# Patient Record
Sex: Male | Born: 1985 | Race: White | Hispanic: No | Marital: Single
Health system: Southern US, Community
[De-identification: ages and names within clinical notes are randomized; demographics above are authoritative.]

## PROBLEM LIST (undated history)

## (undated) DIAGNOSIS — S069XAA Unspecified intracranial injury with loss of consciousness status unknown, initial encounter: Secondary | ICD-10-CM

## (undated) DIAGNOSIS — I10 Essential (primary) hypertension: Secondary | ICD-10-CM

---

## 2021-08-16 ENCOUNTER — Emergency Department: Payer: Medicare PPO

## 2021-08-16 ENCOUNTER — Other Ambulatory Visit: Payer: Self-pay

## 2021-08-16 ENCOUNTER — Emergency Department
Admission: EM | Admit: 2021-08-16 | Discharge: 2021-12-15 | Disposition: A | Discharge status: Home / Self Care | Payer: Medicare PPO | Attending: Emergency Medicine | Admitting: Emergency Medicine

## 2021-08-16 DIAGNOSIS — Z20822 Contact with and (suspected) exposure to covid-19: Secondary | ICD-10-CM | POA: Insufficient documentation

## 2021-08-16 DIAGNOSIS — Z789 Other specified health status: Secondary | ICD-10-CM

## 2021-08-16 DIAGNOSIS — R339 Retention of urine, unspecified: Secondary | ICD-10-CM | POA: Diagnosis not present

## 2021-08-16 DIAGNOSIS — Z741 Need for assistance with personal care: Secondary | ICD-10-CM | POA: Diagnosis not present

## 2021-08-16 HISTORY — DX: Essential (primary) hypertension: I10

## 2021-08-16 HISTORY — DX: Unspecified intracranial injury with loss of consciousness status unknown, initial encounter: S06.9XAA

## 2021-08-16 LAB — COMPREHENSIVE METABOLIC PANEL
ALT: 43 U/L (ref 0–44)
AST: 26 U/L (ref 15–41)
Albumin: 4.2 g/dL (ref 3.5–5.0)
Alkaline Phosphatase: 47 U/L (ref 38–126)
Anion gap: 3 — ABNORMAL LOW (ref 5–15)
BUN: 8 mg/dL (ref 6–20)
CO2: 29 mmol/L (ref 22–32)
Calcium: 9 mg/dL (ref 8.9–10.3)
Chloride: 106 mmol/L (ref 98–111)
Creatinine, Ser: 0.66 mg/dL (ref 0.61–1.24)
GFR, Estimated: >60 mL/min (ref >60)
Glucose, Bld: 97 mg/dL (ref 70–99)
Potassium: 4.1 mmol/L (ref 3.5–5.1)
Sodium: 138 mmol/L (ref 135–145)
Total Bilirubin: 0.7 mg/dL (ref 0.3–1.2)
Total Protein: 7.2 g/dL (ref 6.5–8.1)

## 2021-08-16 LAB — CBC WITH DIFFERENTIAL/PLATELET
Abs Immature Granulocytes: 0.01 10*3/uL (ref 0.00–0.07)
Basophils Absolute: 0 10*3/uL (ref 0.0–0.1)
Basophils Relative: 1 %
Eosinophils Absolute: 0.1 10*3/uL (ref 0.0–0.5)
Eosinophils Relative: 2 %
HCT: 42 % (ref 39.0–52.0)
Hemoglobin: 13.5 g/dL (ref 13.0–17.0)
Immature Granulocytes: 0 %
Lymphocytes Relative: 37 %
Lymphs Abs: 1.7 10*3/uL (ref 0.7–4.0)
MCH: 27.8 pg (ref 26.0–34.0)
MCHC: 32.1 g/dL (ref 30.0–36.0)
MCV: 86.6 fL (ref 80.0–100.0)
Monocytes Absolute: 0.5 10*3/uL (ref 0.1–1.0)
Monocytes Relative: 11 %
Neutro Abs: 2.3 10*3/uL (ref 1.7–7.7)
Neutrophils Relative %: 49 %
Platelets: 263 10*3/uL (ref 150–400)
RBC: 4.85 MIL/uL (ref 4.22–5.81)
RDW: 12.8 % (ref 11.5–15.5)
WBC: 4.7 10*3/uL (ref 4.0–10.5)
nRBC: 0 % (ref 0.0–0.2)

## 2021-08-16 LAB — URINALYSIS, ROUTINE W REFLEX MICROSCOPIC
Bilirubin Urine: NEGATIVE
Glucose, UA: NEGATIVE mg/dL
Hgb urine dipstick: NEGATIVE
Ketones, ur: NEGATIVE mg/dL
Leukocytes,Ua: NEGATIVE
Nitrite: NEGATIVE
Protein, ur: NEGATIVE mg/dL
Specific Gravity, Urine: 1.002 — ABNORMAL LOW (ref 1.005–1.030)
pH: 7 (ref 5.0–8.0)

## 2021-08-16 LAB — RESP PANEL BY RT-PCR (FLU A&B, COVID) ARPGX2
Influenza A by PCR: NEGATIVE
Influenza B by PCR: NEGATIVE
SARS Coronavirus 2 by RT PCR: NEGATIVE

## 2021-08-16 MED ORDER — LINACLOTIDE 72 MCG PO CAPS
145.0000 ug | ORAL_CAPSULE | Freq: Every day | ORAL | Status: DC
  Administered 2021-08-17 – 2021-10-21 (×63): 145 ug via ORAL
  Administered 2021-10-22: 144 ug via ORAL
  Administered 2021-10-23 – 2021-11-03 (×11): 145 ug via ORAL
  Administered 2021-11-04: 144 ug via ORAL
  Administered 2021-11-05 – 2021-11-10 (×5): 145 ug via ORAL
  Administered 2021-11-11: 144 ug via ORAL
  Administered 2021-11-12: 145 ug via ORAL
  Administered 2021-11-14 – 2021-11-15 (×2): 144 ug via ORAL
  Administered 2021-11-16: 145 ug via ORAL
  Administered 2021-11-18 – 2021-11-19 (×2): 144 ug via ORAL
  Administered 2021-11-21 – 2021-11-26 (×6): 145 ug via ORAL
  Administered 2021-11-27: 144 ug via ORAL
  Administered 2021-11-28 – 2021-11-30 (×3): 145 ug via ORAL
  Administered 2021-12-01: 144 ug via ORAL
  Administered 2021-12-02 – 2021-12-10 (×8): 145 ug via ORAL
  Administered 2021-12-11 – 2021-12-12 (×2): 144 ug via ORAL
  Administered 2021-12-13 – 2021-12-15 (×3): 145 ug via ORAL
  Filled 2021-08-16: qty 2
  Filled 2021-08-16 (×2): qty 1
  Filled 2021-08-16 (×2): qty 2
  Filled 2021-08-16: qty 1
  Filled 2021-08-16: qty 2
  Filled 2021-08-16: qty 1
  Filled 2021-08-16 (×2): qty 2
  Filled 2021-08-16 (×7): qty 1
  Filled 2021-08-16: qty 2
  Filled 2021-08-16: qty 1
  Filled 2021-08-16: qty 2
  Filled 2021-08-16 (×2): qty 1
  Filled 2021-08-16: qty 2
  Filled 2021-08-16: qty 1
  Filled 2021-08-16: qty 2
  Filled 2021-08-16: qty 1
  Filled 2021-08-16: qty 2
  Filled 2021-08-16 (×3): qty 1
  Filled 2021-08-16: qty 2
  Filled 2021-08-16: qty 1
  Filled 2021-08-16 (×2): qty 2
  Filled 2021-08-16: qty 1
  Filled 2021-08-16: qty 2
  Filled 2021-08-16 (×3): qty 1
  Filled 2021-08-16 (×4): qty 2
  Filled 2021-08-16: qty 1
  Filled 2021-08-16: qty 2
  Filled 2021-08-16: qty 1
  Filled 2021-08-16: qty 2
  Filled 2021-08-16: qty 1
  Filled 2021-08-16 (×2): qty 2
  Filled 2021-08-16 (×2): qty 1
  Filled 2021-08-16: qty 2
  Filled 2021-08-16: qty 1
  Filled 2021-08-16 (×3): qty 2
  Filled 2021-08-16 (×2): qty 1
  Filled 2021-08-16: qty 2
  Filled 2021-08-16 (×7): qty 1
  Filled 2021-08-16: qty 2
  Filled 2021-08-16 (×2): qty 1
  Filled 2021-08-16: qty 2
  Filled 2021-08-16: qty 1
  Filled 2021-08-16 (×2): qty 2
  Filled 2021-08-16: qty 1
  Filled 2021-08-16: qty 2
  Filled 2021-08-16 (×3): qty 1
  Filled 2021-08-16 (×4): qty 2
  Filled 2021-08-16 (×2): qty 1
  Filled 2021-08-16: qty 2
  Filled 2021-08-16: qty 1
  Filled 2021-08-16: qty 2
  Filled 2021-08-16: qty 1
  Filled 2021-08-16: qty 2
  Filled 2021-08-16: qty 1
  Filled 2021-08-16 (×3): qty 2
  Filled 2021-08-16 (×2): qty 1
  Filled 2021-08-16 (×2): qty 2
  Filled 2021-08-16 (×3): qty 1
  Filled 2021-08-16 (×2): qty 2
  Filled 2021-08-16 (×2): qty 1
  Filled 2021-08-16 (×2): qty 2
  Filled 2021-08-16: qty 1
  Filled 2021-08-16: qty 2
  Filled 2021-08-16: qty 1
  Filled 2021-08-16 (×5): qty 2
  Filled 2021-08-16: qty 1
  Filled 2021-08-16: qty 2
  Filled 2021-08-16 (×3): qty 1
  Filled 2021-08-16 (×3): qty 2
  Filled 2021-08-16 (×4): qty 1
  Filled 2021-08-16: qty 2

## 2021-08-16 MED ORDER — CLINDAMYCIN PHOSPHATE 1 % EX GEL
1.0000 "application " | Freq: Every day | CUTANEOUS | Status: DC | PRN
  Filled 2021-08-16: qty 30

## 2021-08-16 MED ORDER — TACROLIMUS 0.1 % EX OINT: 1.0000 "application " | TOPICAL_OINTMENT | Freq: Two times a day (BID) | CUTANEOUS | Status: DC

## 2021-08-16 MED ORDER — RIVAROXABAN 10 MG PO TABS
10.0000 mg | ORAL_TABLET | Freq: Every evening | ORAL | Status: DC
  Administered 2021-08-16 – 2021-12-15 (×119): 10 mg via ORAL
  Filled 2021-08-16 (×134): qty 1

## 2021-08-16 MED ORDER — DIAZEPAM 2 MG PO TABS
2.0000 mg | ORAL_TABLET | Freq: Every day | ORAL | Status: DC
  Administered 2021-08-16 – 2021-12-14 (×115): 2 mg via ORAL
  Filled 2021-08-16 (×120): qty 1

## 2021-08-16 MED ORDER — MODAFINIL 100 MG PO TABS
50.0000 mg | ORAL_TABLET | Freq: Every morning | ORAL | Status: DC
  Administered 2021-08-17 – 2021-12-15 (×115): 50 mg via ORAL
  Filled 2021-08-16 (×126): qty 1

## 2021-08-16 MED ORDER — BUSPIRONE HCL 5 MG PO TABS
10.0000 mg | ORAL_TABLET | Freq: Three times a day (TID) | ORAL | Status: DC
Start: 2021-08-16 — End: 2021-08-27
  Administered 2021-08-17 – 2021-08-27 (×31): 10 mg via ORAL
  Filled 2021-08-16 (×35): qty 2

## 2021-08-16 MED ORDER — LORATADINE 10 MG PO TABS
10.0000 mg | ORAL_TABLET | Freq: Every day | ORAL | Status: DC
  Administered 2021-08-16 – 2021-12-15 (×121): 10 mg via ORAL
  Filled 2021-08-16 (×122): qty 1

## 2021-08-16 MED ORDER — IPRATROPIUM BROMIDE 0.06 % NA SOLN
2.0000 | Freq: Three times a day (TID) | NASAL | Status: DC
  Administered 2021-08-16 – 2021-12-12 (×223): 2 via NASAL
  Filled 2021-08-16 (×4): qty 15

## 2021-08-16 MED ORDER — LAMOTRIGINE 25 MG PO TABS
50.0000 mg | ORAL_TABLET | Freq: Every morning | ORAL | Status: DC
Start: 2021-08-17 — End: 2021-09-16
  Administered 2021-08-17 – 2021-09-16 (×31): 50 mg via ORAL
  Filled 2021-08-16 (×32): qty 2

## 2021-08-16 MED ORDER — PROPRANOLOL HCL 20 MG PO TABS
10.0000 mg | ORAL_TABLET | Freq: Two times a day (BID) | ORAL | Status: DC
  Administered 2021-08-16 – 2021-12-15 (×232): 10 mg via ORAL
  Filled 2021-08-16 (×150): qty 1
  Filled 2021-08-16: qty 0.5
  Filled 2021-08-16 (×92): qty 1

## 2021-08-16 MED ORDER — VITAMIN D 25 MCG (1000 UNIT) PO TABS
1000.0000 [IU] | ORAL_TABLET | Freq: Every day | ORAL | Status: DC
  Administered 2021-08-16 – 2021-12-15 (×121): 1000 [IU] via ORAL
  Filled 2021-08-16 (×123): qty 1

## 2021-08-16 MED ORDER — CLONIDINE HCL 0.1 MG PO TABS
0.1000 mg | ORAL_TABLET | Freq: Every day | ORAL | Status: DC
Start: 2021-08-16 — End: 2021-12-15
  Administered 2021-08-16 – 2021-12-14 (×118): 0.1 mg via ORAL
  Filled 2021-08-16 (×124): qty 1

## 2021-08-16 MED ORDER — CICLOPIROX OLAMINE 0.77 % EX CREA: 1.0000 "application " | TOPICAL_CREAM | Freq: Two times a day (BID) | CUTANEOUS | Status: DC

## 2021-08-16 MED ORDER — TAMSULOSIN HCL 0.4 MG PO CAPS
0.4000 mg | ORAL_CAPSULE | Freq: Every day | ORAL | Status: DC
  Administered 2021-08-16 – 2021-12-15 (×121): 0.4 mg via ORAL
  Filled 2021-08-16 (×122): qty 1

## 2021-08-16 MED ORDER — AMMONIUM LACTATE 12 % EX LOTN
1.0000 "application " | TOPICAL_LOTION | Freq: Two times a day (BID) | CUTANEOUS | Status: DC
  Administered 2021-08-17 – 2021-12-14 (×164): 1 via TOPICAL
  Filled 2021-08-16 (×5): qty 400

## 2021-08-16 MED ORDER — GABAPENTIN 100 MG PO CAPS
100.0000 mg | ORAL_CAPSULE | Freq: Three times a day (TID) | ORAL | Status: DC
  Administered 2021-08-16 – 2021-08-22 (×17): 100 mg via ORAL
  Administered 2021-08-22: 200 mg via ORAL
  Administered 2021-08-22: 100 mg via ORAL
  Administered 2021-08-23 – 2021-08-25 (×6): 200 mg via ORAL
  Administered 2021-08-25 – 2021-08-27 (×8): 100 mg via ORAL
  Administered 2021-08-28: 200 mg via ORAL
  Administered 2021-08-28 – 2021-09-01 (×14): 100 mg via ORAL
  Administered 2021-09-02: 200 mg via ORAL
  Administered 2021-09-02: 100 mg via ORAL
  Administered 2021-09-02: 200 mg via ORAL
  Administered 2021-09-03 – 2021-09-04 (×6): 100 mg via ORAL
  Administered 2021-09-05: 200 mg via ORAL
  Administered 2021-09-05 – 2021-09-09 (×12): 100 mg via ORAL
  Administered 2021-09-09: 200 mg via ORAL
  Administered 2021-09-09 – 2021-09-10 (×3): 100 mg via ORAL
  Administered 2021-09-10: 200 mg via ORAL
  Administered 2021-09-11 – 2021-09-15 (×14): 100 mg via ORAL
  Filled 2021-08-16 (×9): qty 1
  Filled 2021-08-16 (×2): qty 2
  Filled 2021-08-16 (×4): qty 1
  Filled 2021-08-16 (×2): qty 2
  Filled 2021-08-16 (×2): qty 1
  Filled 2021-08-16: qty 2
  Filled 2021-08-16 (×2): qty 1
  Filled 2021-08-16: qty 2
  Filled 2021-08-16: qty 1
  Filled 2021-08-16: qty 2
  Filled 2021-08-16 (×15): qty 1
  Filled 2021-08-16: qty 2
  Filled 2021-08-16 (×5): qty 1
  Filled 2021-08-16: qty 2
  Filled 2021-08-16 (×6): qty 1
  Filled 2021-08-16: qty 2
  Filled 2021-08-16: qty 1
  Filled 2021-08-16 (×2): qty 2
  Filled 2021-08-16 (×10): qty 1
  Filled 2021-08-16: qty 2
  Filled 2021-08-16 (×6): qty 1
  Filled 2021-08-16: qty 2
  Filled 2021-08-16 (×3): qty 1
  Filled 2021-08-16: qty 2
  Filled 2021-08-16 (×11): qty 1

## 2021-08-16 MED ORDER — TRAZODONE HCL 50 MG PO TABS
50.0000 mg | ORAL_TABLET | Freq: Every day | ORAL | Status: DC
Start: 2021-08-16 — End: 2021-12-15
  Administered 2021-08-16 – 2021-12-14 (×118): 50 mg via ORAL
  Filled 2021-08-16 (×124): qty 1

## 2021-08-16 MED ORDER — CITALOPRAM HYDROBROMIDE 20 MG PO TABS
20.0000 mg | ORAL_TABLET | Freq: Two times a day (BID) | ORAL | Status: DC
Start: 2021-08-16 — End: 2021-12-15
  Administered 2021-08-16 – 2021-12-15 (×241): 20 mg via ORAL
  Filled 2021-08-16 (×242): qty 1

## 2021-08-16 MED ORDER — OMEPRAZOLE 20 MG PO CPDR
20.0000 mg | DELAYED_RELEASE_CAPSULE | Freq: Every day | ORAL | Status: DC
  Filled 2021-08-16: qty 1

## 2021-08-16 NOTE — ED Notes (Signed)
Pt arrived with condom cath in place. Pt cleaned and linens changed.  ?

## 2021-08-16 NOTE — ED Provider Notes (Signed)
? ?[facility] ?Provider Note ? ? ? Event Date/Time  ? First MD Initiated Contact with Patient 08/16/21 1502   ?  (approximate) ? ? ?History  ? ?Urinary Retention ? ? ?HPI ? ?Derrick Jordan is a 36 y.o. male with past medical history of TBI, bedbound, here with initial report of urinary retention.  However, patient is voiding spontaneously.  He denies any complaints to me, the history is somewhat limited due to his cognition.  Per report from EMS, they were initially called to the scene due to urinary retention.  However, mother then stated that she is having too much difficulty taking care of him and does not feel safe managing him at home by herself.  They have home health but it has not been helping or is an opportune time.  She is not available currently, but will reassess once available.  On my assessment, patient denies any complaints.  Denies any pain.  Shakes his head no to any chest pain or abdominal pain.  Remainder of history limited due to mental status. ? ?Level 5 caveat invoked as remainder of history, ROS, and physical exam limited due to patient's cognition. ? ? ? ?Physical Exam  ? ?Triage Vital Signs: ?ED Triage Vitals  ?Enc Vitals Group  ?   BP 08/16/21 1450 122/78  ?   Pulse Rate 08/16/21 1452 82  ?   Resp 08/16/21 1450 18  ?   Temp 08/16/21 1453 98.1 ?F (36.7 ?C)  ?   Temp Source 08/16/21 1453 Oral  ?   SpO2 08/16/21 1452 98 %  ?   Weight --   ?   Height --   ?   Head Circumference --   ?   Peak Flow --   ?   Pain Score 08/16/21 1453 0  ?   Pain Loc --   ?   Pain Edu? --   ?   Excl. in GC? --   ? ? ?Most recent vital signs: ?Vitals:  ? 08/16/21 2219 08/16/21 2221  ?BP: 124/75 124/75  ?Pulse: 73 82  ?Resp:  17  ?Temp:    ?SpO2:  98%  ? ? ? ?General: Awake, no distress. 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 ?CV:  Good peripheral perfusion. Heart RRR. ?Resp:  Normal effort. Lungs CTAB.  ?Abd:  No distention. No TTP. Old scars from PEG tube, surgeries. ?Other:  No significant  wounds/pressure ulcers noted. ? ? ?ED Results / Procedures / Treatments  ? ?Labs ?(all labs ordered are listed, but only abnormal results are displayed) ?Labs Reviewed  ?COMPREHENSIVE METABOLIC PANEL - Abnormal; Notable for the following components:  ?    Result Value  ? Anion gap 3 (*)   ? All other components within normal limits  ?URINALYSIS, ROUTINE W REFLEX MICROSCOPIC - Abnormal; Notable for the following components:  ? Color, Urine YELLOW (*)   ? APPearance CLEAR (*)   ? Specific Gravity, Urine 1.002 (*)   ? All other components within normal limits  ?RESP PANEL BY RT-PCR (FLU A&B, COVID) ARPGX2  ?CBC WITH DIFFERENTIAL/PLATELET  ? ? ? ?EKG ? ? ? ?RADIOLOGY ?CXR: Low lung volumes, likely atelectasis ? ? ?I also independently reviewed and agree with radiologist interpretations. ? ? ?PROCEDURES: ? ?Critical Care performed: No ? ?MEDICATIONS ORDERED IN ED: ?Medications  ?ammonium lactate (LAC-HYDRIN) 12 % lotion 1 application. (1 application. Topical Given 08/17/21 0028)  ?busPIRone (BUSPAR) tablet 10 mg (10 mg Oral Given 08/17/21 0027)  ?cholecalciferol (VITAMIN  D3) tablet 1,000 Units (1,000 Units Oral Given 08/16/21 2224)  ?ciclopirox (LOPROX) 0.77 % cream 1 application. (has no administration in time range)  ?citalopram (CELEXA) tablet 20 mg (20 mg Oral Given 08/16/21 2229)  ?clindamycin (CLINDAGEL) 1 % gel 1 application. (has no administration in time range)  ?cloNIDine (CATAPRES) tablet 0.1 mg (0.1 mg Oral Given 08/16/21 2224)  ?diazepam (VALIUM) tablet 2 mg (2 mg Oral Given 08/16/21 2226)  ?loratadine (CLARITIN) tablet 10 mg (10 mg Oral Given 08/16/21 2229)  ?gabapentin (NEURONTIN) capsule 100-200 mg (100 mg Oral Given 08/16/21 2232)  ?ipratropium (ATROVENT) 0.06 % nasal spray 2 spray (2 sprays Each Nare Given 08/16/21 2238)  ?lamoTRIgine (LAMICTAL) tablet 50 mg (has no administration in time range)  ?linaclotide (LINZESS) capsule 145 mcg (has no administration in time range)  ?modafinil (PROVIGIL) tablet 50 mg (has  no administration in time range)  ?omeprazole (PRILOSEC) capsule 20 mg (has no administration in time range)  ?propranolol (INDERAL) tablet 10 mg (10 mg Oral Given 08/16/21 2219)  ?tacrolimus (PROTOPIC) 0.1 % ointment 1 application. (has no administration in time range)  ?tamsulosin (FLOMAX) capsule 0.4 mg (0.4 mg Oral Given 08/16/21 2230)  ?rivaroxaban (XARELTO) tablet 10 mg (10 mg Oral Given 08/16/21 2227)  ?traZODone (DESYREL) tablet 50 mg (50 mg Oral Given 08/16/21 2232)  ? ? ? ?IMPRESSION / MDM / ASSESSMENT AND PLAN / ED COURSE  ?I reviewed the triage vital signs and the nursing notes. ?             ?               ? ? ?The patient is on the cardiac monitor to evaluate for evidence of arrhythmia and/or significant heart rate changes. ? ? ?MDM:  ?36 yo M with h/o TBI here with initial report of urinary retention. Pt voiding spontaneously, however, with no abd discomfort. Discussed pt's care with his mother via telephone - pt has a fairly complex care regimen, which she unfortunately is not able to provide at home at this time. She states that pt has otherwise not been unwell and is at his baseline. No recent illnesses. She had been working with home health, [facility], and [facility] but things "fell through" so she now is not capable of managing his care at home.  ? ?Clinically, pt is overall well and he denies any complaints. Screening labs obtained and are unremarkable. He is voiding well. Will place consult to social work/TOC for placement. No apparent emergent medical condition at this time. Pharmacy has reconciled meds, which were reordered in ED. ? ? ? ? ?MEDICATIONS GIVEN IN ED: ?Medications  ?ammonium lactate (LAC-HYDRIN) 12 % lotion 1 application. (1 application. Topical Given 08/17/21 0028)  ?busPIRone (BUSPAR) tablet 10 mg (10 mg Oral Given 08/17/21 0027)  ?cholecalciferol (VITAMIN D3) tablet 1,000 Units (1,000 Units Oral Given 08/16/21 2224)  ?ciclopirox (LOPROX) 0.77 % cream 1 application. (has no administration in  time range)  ?citalopram (CELEXA) tablet 20 mg (20 mg Oral Given 08/16/21 2229)  ?clindamycin (CLINDAGEL) 1 % gel 1 application. (has no administration in time range)  ?cloNIDine (CATAPRES) tablet 0.1 mg (0.1 mg Oral Given 08/16/21 2224)  ?diazepam (VALIUM) tablet 2 mg (2 mg Oral Given 08/16/21 2226)  ?loratadine (CLARITIN) tablet 10 mg (10 mg Oral Given 08/16/21 2229)  ?gabapentin (NEURONTIN) capsule 100-200 mg (100 mg Oral Given 08/16/21 2232)  ?ipratropium (ATROVENT) 0.06 % nasal spray 2 spray (2 sprays Each Nare Given 08/16/21 2238)  ?lamoTRIgine (LAMICTAL) tablet  50 mg (has no administration in time range)  ?linaclotide (LINZESS) capsule 145 mcg (has no administration in time range)  ?modafinil (PROVIGIL) tablet 50 mg (has no administration in time range)  ?omeprazole (PRILOSEC) capsule 20 mg (has no administration in time range)  ?propranolol (INDERAL) tablet 10 mg (10 mg Oral Given 08/16/21 2219)  ?tacrolimus (PROTOPIC) 0.1 % ointment 1 application. (has no administration in time range)  ?tamsulosin (FLOMAX) capsule 0.4 mg (0.4 mg Oral Given 08/16/21 2230)  ?rivaroxaban (XARELTO) tablet 10 mg (10 mg Oral Given 08/16/21 2227)  ?traZODone (DESYREL) tablet 50 mg (50 mg Oral Given 08/16/21 2232)  ? ? ? ?Consults:  ? ? ? ?EMR reviewed  ? ? ? ? ? ?FINAL CLINICAL IMPRESSION(S) / ED DIAGNOSES  ? ?Final diagnoses:  ?Unable to care for self  ? ? ? ?Rx / DC Orders  ? ?ED Discharge Orders   ? ? None  ? ?  ? ? ? ?Note:  This document was prepared using Dragon voice recognition software and may include unintentional dictation errors. ?  ?Shaune Pollack, MD ?08/17/21 0234 ? ?

## 2021-08-16 NOTE — ED Triage Notes (Addendum)
Patient to ER via [facility] from home. 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 Some verbal communication, alert and oriented to self, place, situation ? ?Patient's main caregiver is mother who states she can no longer take care of him. Mother told EMS that patient has therapy in mornings but is unable to participate and that it needs to be in the afternoon. ? ?Initial complaint was urinary retention however per EMS patient filled urinal PTA. Patient denies pain. Empty drainage bag connected to condom catheter on arrival. ? ?EMS VS- hr 83, ra sats 99%, bp 140/91  ?

## 2021-08-16 NOTE — ED Notes (Signed)
Derrick Jordan mother [phone] ?

## 2021-08-16 NOTE — ED Notes (Signed)
MD spoke with mother unsure of name [phone] ? ?

## 2021-08-16 NOTE — ED Notes (Signed)
Pt removing all his monitor cords wanting to know why he is here.  ?

## 2021-08-16 NOTE — ED Notes (Signed)
Spoke with [employer] to get mothers number was given a number and after 1 to 2 rings going to voicemail ?

## 2021-08-16 NOTE — ED Notes (Signed)
Unable to contact mother.

## 2021-08-16 NOTE — ED Notes (Signed)
Patient bladder scanned. Bladder scan volume shows >269ml. ?

## 2021-08-17 DIAGNOSIS — Z741 Need for assistance with personal care: Secondary | ICD-10-CM | POA: Diagnosis not present

## 2021-08-17 MED ORDER — PANTOPRAZOLE SODIUM 40 MG PO TBEC
40.0000 mg | DELAYED_RELEASE_TABLET | Freq: Every day | ORAL | Status: DC
Start: 2021-08-17 — End: 2021-12-15
  Administered 2021-08-17 – 2021-12-15 (×119): 40 mg via ORAL
  Filled 2021-08-17 (×121): qty 1

## 2021-08-17 NOTE — ED Notes (Signed)
Pt helped with urinal. When asked if pt wants to be put on male purwick or condom cath for comfort, pt refuses and only wants to use urinal. ?

## 2021-08-17 NOTE — ED Notes (Signed)
Pt soiled with urine and food from lunch. Pt accidentally slipped his drink onto himself. Full linen change including sheet, pad, brief, and gown. Pt refused to place male purewick back on and states he just wants to be in the brief.  ?

## 2021-08-17 NOTE — ED Notes (Signed)
Lunch meal tray given at this time.  

## 2021-08-17 NOTE — ED Notes (Signed)
Mother at bedside.

## 2021-08-17 NOTE — ED Notes (Signed)
Given pt breakfast tray, repositioned pt in the bed at this time.  ?

## 2021-08-17 NOTE — ED Notes (Signed)
Helped pt administer PO medication by giving them to him with a spoon. Pt is able to take pills whole. Turned TV on and increased volume so pt can watch TV at this time. ?

## 2021-08-17 NOTE — ED Notes (Signed)
This RN at bedside, pt does not want breakfast at this time.  ?

## 2021-08-17 NOTE — ED Notes (Addendum)
Pt mother gave pt 2mg  motegrity from home supply with this RN at bedside to verify medication ?

## 2021-08-17 NOTE — TOC Initial Note (Signed)
Transition of Care (TOC) - Progression Note  ? ? ?Patient Details  ?Name: Chevez Sambrano ?MRN: [id number] ?Date of Birth: 1986-03-23 ? ?Transition of Care (TOC) CM/SW Contact  ?Taher Vannote L Chosen Geske, LCSWA ?Phone Number: ?08/17/2021, 12:50 PM ? ?Clinical Narrative:    ? ?TOC consult for SNF. Per triage notes, patient is bedbound and patient's mother reported she can no longer take care of him. CSW called patient's mother Judie Bonus. [phone] to discuss preferred [facility] plan of care.  ? ?TOC plan of care ongoing.  ?  ?  ? ?Expected Discharge Plan and Services ?  ?  ?  ?  ?  ?                ?  ?  ?  ?  ?  ?  ?  ?  ?  ?  ? ? ?Social Determinants of Health (SDOH) Interventions ?  ? ?Readmission Risk Interventions ?   ? View : No data to display.  ?  ?  ?  ? ? ?

## 2021-08-17 NOTE — ED Notes (Signed)
Given at cup of water and cup of coffee at this time. Pt needs to changed at this time. Pt refused at this time, explained that we would need to clean him up before the shift ended. Pt verbalized understanding at this time.  ?

## 2021-08-17 NOTE — TOC Progression Note (Signed)
Transition of Care (TOC) - Progression Note  ? ? ?Patient Details  ?Name: Zaylor Jaquish ?MRN: [id number] ?Date of Birth: 1985-09-05 ? ?Transition of Care (TOC) CM/SW Contact  ?Isabela, LCSWA ?Phone Number: ?08/17/2021, 4:10 PM ? ?Clinical Narrative:    ? ?CSW spoke to patient's mother Maudy. Maudy reported having a meeting scheduled with team with [facility] and [facility] tomorrow. Maudy reported the patient has an innovations waiver helping to financially cover the patient's care needs. Maudy explained they would prefer getting care within the home as opposed to a care facility. Maudy explained issues with current private care agency ([facility] increasing prices). CSW offered [facility] list of private care agencies and she accepted but stated she wants to wait to see what is determined at the team meeting tomorrow before switching agencies.  ? ? CSW provided [facility] with weekday staff contact information. TOC plan of care ongoing.  ?  ? ?Expected Discharge Plan and Services ?  ?  ?  ?  ?  ?                ?  ?  ?  ?  ?  ?  ?  ?  ?  ?  ? ? ?Social Determinants of Health (SDOH) Interventions ?  ? ?Readmission Risk Interventions ?   ? View : No data to display.  ?  ?  ?  ? ? ?

## 2021-08-18 DIAGNOSIS — Z741 Need for assistance with personal care: Secondary | ICD-10-CM | POA: Diagnosis not present

## 2021-08-18 NOTE — ED Notes (Signed)
Family provided food for pt, pt meal tray at table.  ?

## 2021-08-18 NOTE — ED Notes (Signed)
Patient eating breakfast at this time.

## 2021-08-18 NOTE — ED Notes (Signed)
Pt had urinated and had bowel movement on self. Cleaned by this RN and Research scientist (life sciences). New bed sheets applied.  ?

## 2021-08-18 NOTE — ED Notes (Signed)
Patient's bed wet. Patient cleaned, bed liens changed, and new gown placed. Patient repositioned in the bed. Bed lower and locked. Apple sauce and water within reach of patient. Remote given to patient and lights turned off as requested.  ?

## 2021-08-18 NOTE — TOC Progression Note (Signed)
Transition of Care (TOC) - Progression Note  ? ? ?Patient Details  ?Name: Derrick Jordan ?MRN: [id number] ?Date of Birth: 1985-11-16 ? ?Transition of Care (TOC) CM/SW Contact  ?Allayne Butcher, RN ?Phone Number: ?08/18/2021, 4:36 PM ? ?Clinical Narrative:    ? ?Attempted to call patient's mother, no answer- left voicemail for return call.  ? ?  ?  ? ?Expected Discharge Plan and Services ?  ?  ?  ?  ?  ?                ?  ?  ?  ?  ?  ?  ?  ?  ?  ?  ? ? ?Social Determinants of Health (SDOH) Interventions ?  ? ?Readmission Risk Interventions ?   ? View : No data to display.  ?  ?  ?  ? ? ?

## 2021-08-18 NOTE — ED Notes (Signed)
Lunch tray at bedside. ?

## 2021-08-18 NOTE — ED Provider Notes (Signed)
----------------------------------------- ?  5:02 AM on 08/18/2021 ?----------------------------------------- ? ? ?Blood pressure 100/70, pulse 75, temperature 97.6 ?F (36.4 ?C), temperature source Oral, resp. rate 16, SpO2 97 %. ? ?The patient is calm and cooperative at this time.  There have been no acute events since the last update.  Awaiting disposition plan from Social Work team(s). ?  ?Irean Hong, MD ?08/18/21 0502 ? ?

## 2021-08-19 DIAGNOSIS — Z741 Need for assistance with personal care: Secondary | ICD-10-CM | POA: Diagnosis not present

## 2021-08-19 NOTE — ED Notes (Signed)
Assisted pt with setting up his lunch tray and cut up pt's food for him. Pt requesting remote to tv; stated this RN will try to find one for him. Pt denies any other needs currently.  ?

## 2021-08-19 NOTE — TOC Progression Note (Signed)
Transition of Care (TOC) - Progression Note  ? ? ?Patient Details  ?Name: Derrick Jordan ?MRN: [id number] ?Date of Birth: February 09, 1986 ? ?Transition of Care (TOC) CM/SW Contact  ?Allayne Butcher, RN ?Phone Number: ?08/19/2021, 5:02 PM ? ?Clinical Narrative:    ?RNCM spoke with patient's mother via phone about plan of care.  Patient had a TBI in 2007, she cared for him at the home from 2007 to 2014 when he was placed at the [facility] in [place].  He lived at the [facility] from 2014 to 2020 and his mother took him out because of the COVID pandemic.  She reports that the patient has an Risk analyst and she should be getting services in the home to care for patient but she has been having to private pay and she cannot afford it any longer- the waiver should cover home services but they have not found an available agency for care.  Mother was working with [facility] but will switch to [facility] since she moved from [facility] to [place].  [facility] CM [place], [phone], his supervisor Lorita Officer [phone].  Patient has not been assigned a [facility] Case worker yet. ? ?TOC will reach out to [facility] tomorrow to touch base on what they have been working on.   ? ? ? ?Expected Discharge Plan:  (IFL- or home with adequate home services) ?Barriers to Discharge: Other (must enter comment) (placement) ? ?Expected Discharge Plan and Services ?Expected Discharge Plan:  (IFL- or home with adequate home services) ?  ?Discharge Planning Services: CM Consult ?  ?Living arrangements for the past 2 months: Single Family Home ?                ?DME Arranged: N/A ?DME Agency: NA ?  ?  ?  ?  ?  ?  ?  ?  ? ? ?Social Determinants of Health (SDOH) Interventions ?  ? ?Readmission Risk Interventions ?   ? View : No data to display.  ?  ?  ?  ? ? ?

## 2021-08-19 NOTE — ED Notes (Addendum)
Pt resting on hospital bed in position of comfort. No needs voiced at this time. Toileting offered, pt denies need at this time. Offered to check pt brief for urine or bowel movements. Pt denies need and does not want assistance at this time. Encouraged pt to verbalize any needs. No c/o voiced by pt.  ?

## 2021-08-19 NOTE — ED Notes (Signed)
Attempted to feed patient breakfast, patient refused at this time. Patient resting, will attempt again when patient is awake. Provided with ice water.  ?

## 2021-08-19 NOTE — ED Notes (Signed)
Pt wet from urine as male external cath had not been put back in place when pt handed off to this RN. Pericare provided and briefs and bed linens changed by this RN and John NT.  ?

## 2021-08-19 NOTE — ED Notes (Signed)
Male external cath applied again.  ?

## 2021-08-19 NOTE — ED Notes (Signed)
Attempted to feed patient breakfast, patient refused at this time. Lights dimmed for comfort. Patient repositioned.  ?

## 2021-08-19 NOTE — ED Provider Notes (Signed)
----------------------------------------- ?  7:00 AM on 08/19/2021 ?----------------------------------------- ? ? ?Blood pressure 116/80, pulse 78, temperature 98.1 ?F (36.7 ?C), temperature source Oral, resp. rate 19, SpO2 100 %. ? ?The patient is calm and cooperative at this time.  There have been no acute events since the last update.  Awaiting disposition plan from [facility] team. ?  ?Hinda Kehr, MD ?08/19/21 1631 ? ?

## 2021-08-20 DIAGNOSIS — Z741 Need for assistance with personal care: Secondary | ICD-10-CM | POA: Diagnosis not present

## 2021-08-20 NOTE — TOC Progression Note (Signed)
Transition of Care (TOC) - Progression Note  ? ? ?Patient Details  ?Name: Derrick Jordan ?MRN: [id number] ?Date of Birth: [birth year] ? ?Transition of Care (TOC) CM/SW Contact  ?Allayne Butcher, RN ?Phone Number: ?08/20/2021, 6:05 PM ? ?Clinical Narrative:    ?Patient's mother did not arrive at the hospital today until 5pm so unable to meet at the bedside. ?RNCM was able to speak with [facility] CM Michel Santee [phone].  Barbara Cower says that he knows the patient and mother very well.  He reports that patient does have an [employer] waiver that would cover home care but the patient's mother has been paying the caregivers from [facility] out of pocket more than what [facility] with the [employer] waiver would pay, this has caused problems, she could loose the waiver, and she promised to pay the workers extra and then did not pay them so they all walked out this past Friday leading to her bringing the patient to the hospital because she cannot care for him.  There are questions about the mother's ability as the patient's guardian.  Barbara Cower made and APS report in [facility] last week.  RNCM will follow up and may make another report tomorrow.   ?Barbara Cower will be handing the case over to [facility] since patient's Medicaid is now in [place].   ? ? ?Expected Discharge Plan:  (IFL- or home with adequate home services) ?Barriers to Discharge: Other (must enter comment) (placement) ? ?Expected Discharge Plan and Services ?Expected Discharge Plan:  (IFL- or home with adequate home services) ?  ?Discharge Planning Services: CM Consult ?  ?Living arrangements for the past 2 months: Single Family Home ?                ?DME Arranged: N/A ?DME Agency: NA ?  ?  ?  ?  ?  ?  ?  ?  ? ? ?Social Determinants of Health (SDOH) Interventions ?  ? ?Readmission Risk Interventions ?   ? View : No data to display.  ?  ?  ?  ? ? ?

## 2021-08-20 NOTE — ED Notes (Signed)
Pt bed soiled and wet. Pt bedding changed and new bedding, brief and external cath placed. Pt in new gown and warm blankets. Pt asking for coffee, but told to wait till morning for that.  ?Pt calm and comfortable at his time.  ?

## 2021-08-20 NOTE — ED Provider Notes (Signed)
Today's Vitals  ? 08/20/21 0125 08/20/21 0300 08/20/21 0415 08/20/21 2951  ?BP: 137/82     ?Pulse: 81     ?Resp: 14     ?Temp: 97.8 ?F (36.6 ?C)     ?TempSrc: Oral     ?SpO2: 97%     ?PainSc:  Asleep Asleep Asleep  ? ?There is no height or weight on file to calculate BMI. ? ? ?No events overnight.  Awaiting social work disposition. ?  ?Dequane Strahan, [name], DO ?08/20/21 8841 ? ?

## 2021-08-20 NOTE — ED Notes (Signed)
Pt turned and was able to eat 75 % of his lunch with nurse assisting  ?

## 2021-08-20 NOTE — ED Notes (Signed)
Patient repositioned in bed for comfort.

## 2021-08-21 DIAGNOSIS — Z741 Need for assistance with personal care: Secondary | ICD-10-CM | POA: Diagnosis not present

## 2021-08-21 NOTE — ED Notes (Signed)
Pt turned and positioned on right side.

## 2021-08-21 NOTE — ED Notes (Signed)
Patient resting quietly at this time.  Respirations even and unlabored.   

## 2021-08-21 NOTE — ED Notes (Signed)
Pharmacy messaged about missing med ?Pt ate 80% of meal, pt given cup of coffee per request. Pt assisted with eating and drinking.  ?

## 2021-08-21 NOTE — TOC Progression Note (Signed)
Transition of Care (TOC) - Progression Note  ? ? ?Patient Details  ?Name: Derrick Jordan ?MRN: [id number] ?Date of Birth: 1985-05-04 ? ?Transition of Care (TOC) CM/SW Contact  ?Allayne Butcher, RN ?Phone Number: ?08/21/2021, 4:45 PM ? ?Clinical Narrative:    ?Joy Sumpter with [place] [employer] has been assigned patient's case.  She will be coming to see patient at the hospital today.  RNCM expressed concerns about mother and her ability to be patient's guardian.   ? ? ?Expected Discharge Plan:  (IFL- or home with adequate home services) ?Barriers to Discharge: Other (must enter comment) (placement) ? ?Expected Discharge Plan and Services ?Expected Discharge Plan:  (IFL- or home with adequate home services) ?  ?Discharge Planning Services: CM Consult ?  ?Living arrangements for the past 2 months: Single Family Home ?                ?DME Arranged: N/A ?DME Agency: NA ?  ?  ?  ?  ?  ?  ?  ?  ? ? ?Social Determinants of Health (SDOH) Interventions ?  ? ?Readmission Risk Interventions ?   ? View : No data to display.  ?  ?  ?  ? ? ?

## 2021-08-21 NOTE — ED Notes (Signed)
Pt cleaned, bedding and pads changed, pericare done, new male external urinary cath placed. Pt set up for breakfast. Pt attempting to feed self with minimal success, MD notified that pt might need OT for adaptive silverware.  ?

## 2021-08-21 NOTE — ED Notes (Signed)
Pt set up for lunch, using silverware and fingers to eat. Pt does not want to use adapter for silverware. Pt ate 90% of meal. Pt watching TV.Pt propped with pillows and blankets.  ?

## 2021-08-21 NOTE — ED Notes (Signed)
50 mg provigil wasted in stericycle with Cathie Beams RN ?

## 2021-08-21 NOTE — ED Provider Notes (Signed)
?    08/20/2021  ? 11:31 PM 08/20/2021  ?  2:35 PM 08/20/2021  ?  1:25 AM  ?Vitals with BMI  ?Systolic 133 113 875  ?Diastolic 67 74 82  ?Pulse 83 76 81  ?  ? ?Patient has been resting comfortably throughout the shift.  He is awaiting social work placement. ?  ?Georga Hacking, MD ?08/21/21 [phone] ? ?

## 2021-08-21 NOTE — ED Notes (Signed)
Pt ate 90% of meal

## 2021-08-21 NOTE — Progress Notes (Signed)
OT Cancellation Note ? ?Patient Details ?Name: Derrick Jordan ?MRN: [id number] ?DOB: [birth year] ? ? ?Cancelled Treatment:    Reason Eval/Treat Not Completed: OT screened, no needs identified, will sign off. Order received and chart reviewed. Pt is total care baseline, reports using hoyer lift for OOB and pt reports self-feeding with SETUP. Provided built up handle and splint check with RN notified to keep washcloth roll in L hand when splint removed. Will sign off.  ? ?Kathie Dike, M.S. OTR/L  ?08/21/21, 1:18 PM  ?ascom [phone] ? ?

## 2021-08-22 DIAGNOSIS — Z741 Need for assistance with personal care: Secondary | ICD-10-CM | POA: Diagnosis not present

## 2021-08-22 MED ORDER — ACETAMINOPHEN 325 MG PO TABS
650.0000 mg | ORAL_TABLET | Freq: Three times a day (TID) | ORAL | Status: DC
  Administered 2021-08-22 – 2021-12-15 (×338): 650 mg via ORAL
  Filled 2021-08-22 (×337): qty 2

## 2021-08-22 NOTE — ED Provider Notes (Signed)
Emergency Medicine Observation Re-evaluation Note ? ?Derrick Jordan is a 36 y.o. male, initially seen for urinary retention.  Currently being seen by social work for placement to an appropriate living facility ? ?No acute events overnight or today. ? ?Physical Exam  ?BP 114/83   Pulse 80   Temp 98.2 ?F (36.8 ?C)   Resp 15   SpO2 98%  ? ?ED Course / MDM  ? ?No recent lab work for review. ? ?Plan  ?Current plan is for placement to an appropriate living facility. ? Derrick Jordan is not under involuntary commitment. ? ? ?  ?Harvest Dark, MD ?08/22/21 2251 ? ?

## 2021-08-22 NOTE — TOC Progression Note (Signed)
Transition of Care (TOC) - Progression Note  ? ? ?Patient Details  ?Name: Derrick Jordan ?MRN: [id number] ?Date of Birth: 1985-12-21 ? ?Transition of Care (TOC) CM/SW Contact  ?Shelbie Hutching, RN ?Phone Number: ?08/22/2021, 3:08 PM ? ?Clinical Narrative:    ?SECURE email sent to Doreatha Massed with [facility], with patient clinical information for her to review.  She will assist with discharge planning.  She will be reaching out to patient's IDD coordinator.   ? ?Expected Discharge Plan:  (IFL- or home with adequate home services) ?Barriers to Discharge: Other (must enter comment) (placement) ? ?Expected Discharge Plan and Services ?Expected Discharge Plan:  (IFL- or home with adequate home services) ?  ?Discharge Planning Services: CM Consult ?  ?Living arrangements for the past 2 months: [place] ?                ?DME Arranged: N/A ?DME Agency: NA ?  ?  ?  ?  ?  ?  ?  ?  ? ? ?Social Determinants of Health (SDOH) Interventions ?  ? ?Readmission Risk Interventions ?   ? View : No data to display.  ?  ?  ?  ? ? ?

## 2021-08-22 NOTE — ED Provider Notes (Signed)
Today's Vitals  ? 08/21/21 2040 08/22/21 0200 08/22/21 0300 08/22/21 0638  ?BP: 110/76   134/87  ?Pulse: 82   85  ?Resp:    19  ?Temp:    (!) 97.5 ?F (36.4 ?C)  ?TempSrc:    Oral  ?SpO2: 97%   98%  ?PainSc:  Asleep Asleep   ? ?There is no height or weight on file to calculate BMI. ? ? ?No acute events overnight.  Awaiting social work disposition. ?  ?Zanna Hawn, [name], DO ?08/22/21 0701 ? ?

## 2021-08-22 NOTE — ED Notes (Signed)
Pt removed male external purfit. Bed was saturated in urine. Pt provided with bed bath, new sheets, blanket, and gown. Pt was repositioned on back. Placed brief over purfit for securement.  ?

## 2021-08-22 NOTE — ED Notes (Signed)
Requested xarelto from Rx. ?

## 2021-08-23 DIAGNOSIS — Z741 Need for assistance with personal care: Secondary | ICD-10-CM | POA: Diagnosis not present

## 2021-08-23 MED ORDER — FLEET ENEMA 7-19 GM/118ML RE ENEM
1.0000 | ENEMA | Freq: Once | RECTAL | Status: AC
Start: 2021-08-23 — End: 2021-08-23
  Administered 2021-08-23: 1 via RECTAL

## 2021-08-23 NOTE — ED Notes (Signed)
Private caregiver at bedside gave pt bed bath and cleaned pt up. ?

## 2021-08-23 NOTE — ED Provider Notes (Signed)
----------------------------------------- ?  5:21 AM on 08/23/2021 ?----------------------------------------- ? ? ?Blood pressure 114/83, pulse 80, temperature 98.2 ?F (36.8 ?C), resp. rate 15, SpO2 98 %. ? ?The patient is calm and cooperative at this time.  There have been no acute events since the last update.  Awaiting disposition plan from Social Work team. ?  ?Irean Hong, MD ?08/23/21 [phone] ? ?

## 2021-08-23 NOTE — ED Notes (Signed)
Private caregiver to administer fleet enema per request of mother and patient.  ?

## 2021-08-23 NOTE — ED Notes (Signed)
Condom cath placed back on patient by caregiver. Caregiver left at this time. Pt is now resting quietly in bed with eyes closed. Respirations even and unlabored. No S/S of distress noted.  ?

## 2021-08-23 NOTE — ED Notes (Signed)
Patient resting in bed, assisted patient in turning TV on and gave a sheet.  ?

## 2021-08-23 NOTE — ED Notes (Signed)
TOC

## 2021-08-24 DIAGNOSIS — Z741 Need for assistance with personal care: Secondary | ICD-10-CM | POA: Diagnosis not present

## 2021-08-24 NOTE — ED Notes (Signed)
Pt refusing all medications at this time. Pt watching tv. Pt refuses an food or drink. ?

## 2021-08-24 NOTE — ED Notes (Signed)
RN to bedside to introduce self to pt. Pt is sleeping.  

## 2021-08-24 NOTE — ED Notes (Signed)
Pt given PM medication. Pt caregiver bedside. Pt resting comfortable. No complaints. Caregiver empted urine bag and cleaned up patient.   ?

## 2021-08-24 NOTE — ED Notes (Signed)
This RN helped pt mother clean patient. Pt had a BM and had urinated in the bed.  ?

## 2021-08-24 NOTE — ED Notes (Signed)
Rounded on patient. Pt resting comfortably. Pt mother at bedside. Pt clean and dry. Pt mother feeding patient. Pt mother states that she gave patient motegrity 2mg  because "the hospital does not have it."  ?

## 2021-08-24 NOTE — ED Notes (Signed)
Pt resting comfortable. Patients bottom is clear and dry. Condom cath in place. Pt asked for the remote. Covered patient with a blanket. Pt has no other complaints.  ?

## 2021-08-25 DIAGNOSIS — Z741 Need for assistance with personal care: Secondary | ICD-10-CM | POA: Diagnosis not present

## 2021-08-25 NOTE — ED Notes (Signed)
Pt assisted with breakfast. Repositioned in bed. Pt denies pain.  ?

## 2021-08-25 NOTE — ED Notes (Signed)
Pt resting with eyes closed, repositioned for comfort, awaiting placement.  ?

## 2021-08-25 NOTE — ED Notes (Signed)
Attempted ton contact Magazine features editor (Child psychotherapist), following meeting with DSS, using number provided. Attempted to contact cell [phone] and office number [phone]. Phone calls unsuccessful.  ?

## 2021-08-25 NOTE — ED Provider Notes (Signed)
----------------------------------------- ?  6:34 AM on 08/25/2021 ?----------------------------------------- ? ? ?Blood pressure (!) 126/93, pulse 68, temperature 97.9 ?F (36.6 ?C), temperature source Oral, resp. rate 16, SpO2 100 %. ? ?The patient is calm and cooperative at this time.  There have been no acute events since the last update.  Awaiting disposition plan from Social Work team(s). ?  ?Irean Hong, MD ?08/25/21 [phone] ? ?

## 2021-08-25 NOTE — ED Notes (Signed)
Pt finished with food tray; pt watching tv.  ?

## 2021-08-25 NOTE — ED Notes (Signed)
Report received from [place] RN. Patient care assumed. Patient/RN introduction complete. Will continue to monitor.  ?

## 2021-08-25 NOTE — ED Notes (Signed)
Pt provided with a diet cola at this time. Pt states he does not need to be changed and is dry at this time. ?

## 2021-08-25 NOTE — ED Notes (Signed)
Pt turned onto back at this time.

## 2021-08-25 NOTE — ED Notes (Signed)
Pt alert; pt watching tv.  ?

## 2021-08-25 NOTE — TOC Progression Note (Signed)
Transition of Care (TOC) - Progression Note  ? ? ?Patient Details  ?Name: Derrick Jordan ?MRN: [id number] ?Date of Birth: [birth year] ? ?Transition of Care (TOC) CM/SW Contact  ?Allayne Butcher, RN ?Phone Number: ?08/25/2021, 3:16 PM ? ?Clinical Narrative:    ? ?Corrie Vogal with [facility] is out of the office today and tomorrow, Nancy Fetter with [place] [facility] is here today to see patient.   ?Did message Corrie to see if she had any updates from the IDD coordinator.   ? ?Expected Discharge Plan:  (IFL- or home with adequate home services) ?Barriers to Discharge: Other (must enter comment) (placement) ? ?Expected Discharge Plan and Services ?Expected Discharge Plan:  (IFL- or home with adequate home services) ?  ?Discharge Planning Services: CM Consult ?  ?Living arrangements for the past 2 months: Single Family Home ?                ?DME Arranged: N/A ?DME Agency: NA ?  ?  ?  ?  ?  ?  ?  ?  ? ? ?Social Determinants of Health (SDOH) Interventions ?  ? ?Readmission Risk Interventions ?   ? View : No data to display.  ?  ?  ?  ? ? ?

## 2021-08-25 NOTE — ED Notes (Signed)
Patient resting peacefully with visitor at bedside. No needs at this time.  ?

## 2021-08-25 NOTE — ED Notes (Signed)
Caregiver at bedside

## 2021-08-25 NOTE — ED Notes (Signed)
DSS personnel visiting with pt.  ?

## 2021-08-26 DIAGNOSIS — Z741 Need for assistance with personal care: Secondary | ICD-10-CM | POA: Diagnosis not present

## 2021-08-26 NOTE — ED Notes (Signed)
Patient resting comfortably in hospital bed. Breakfast tray at bedside. No needs expressed to RN. Bed locked and lowered. ?

## 2021-08-26 NOTE — TOC Progression Note (Signed)
Transition of Care (TOC) - Progression Note  ? ? ?Patient Details  ?Name: Cejay Cambre ?MRN: [id number] ?Date of Birth: 29-Dec-1985 ? ?Transition of Care (TOC) CM/SW Contact  ?Allayne Butcher, RN ?Phone Number: ?08/26/2021, 2:48 PM ? ?Clinical Narrative:    ?Per Coralie Carpen from [facility] the current plan is "  At the current time the [facility] team is still working with Renata Caprice Z?s mom to establish a plan with the resources that [facility] has.  Unfortunately, this won?t be a fast process but we are working on making the process as fast as we can.  As I have more information I will provide updates to you.  Currently, it is expected that Derren would return home with extra supports at home.  Details are still being worked on.  I don?t have a specific date at this point." ? ? ?Expected Discharge Plan:  (IFL- or home with adequate home services) ?Barriers to Discharge: Other (must enter comment) (placement) ? ?Expected Discharge Plan and Services ?Expected Discharge Plan:  (IFL- or home with adequate home services) ?  ?Discharge Planning Services: CM Consult ?  ?Living arrangements for the past 2 months: Single Family Home ?                ?DME Arranged: N/A ?DME Agency: NA ?  ?  ?  ?  ?  ?  ?  ?  ? ? ?Social Determinants of Health (SDOH) Interventions ?  ? ?Readmission Risk Interventions ?   ? View : No data to display.  ?  ?  ?  ? ? ?

## 2021-08-26 NOTE — ED Notes (Signed)
This RN to bedside to assess pt. Pt. Denies pain. Pt. Is conversational and pleasant. Pt. Cleaned up from breakfast, and lunch delivered. Pt. Lunch prepared for him, this RN stayed at bedside and fed pt.  ?

## 2021-08-26 NOTE — ED Notes (Signed)
Pt. Mother to bedside with food. Mother fed pt, cleaned pt, moisturized him, kept him company. This RN updated mom on POC. Mother updated this RN on poc. Pt. Alert and oriented. NAD. ?

## 2021-08-26 NOTE — ED Notes (Signed)
Pt's personal PT at bedside. ?

## 2021-08-26 NOTE — ED Notes (Signed)
Patient alert, sitting upright in bed, personal aide feeding patient dinner. Resp even, unlabored on RA. No distress noted at this time. ?

## 2021-08-26 NOTE — ED Notes (Signed)
Patient resting in bed with eyes closed.

## 2021-08-27 DIAGNOSIS — Z741 Need for assistance with personal care: Secondary | ICD-10-CM | POA: Diagnosis not present

## 2021-08-27 MED ORDER — BUSPIRONE HCL 5 MG PO TABS
10.0000 mg | ORAL_TABLET | Freq: Every day | ORAL | Status: DC
  Administered 2021-08-28 – 2021-12-14 (×107): 10 mg via ORAL
  Filled 2021-08-27 (×110): qty 2

## 2021-08-27 MED ORDER — BUSPIRONE HCL 5 MG PO TABS
5.0000 mg | ORAL_TABLET | Freq: Two times a day (BID) | ORAL | Status: DC
  Administered 2021-08-28 – 2021-12-15 (×209): 5 mg via ORAL
  Filled 2021-08-27 (×213): qty 1

## 2021-08-27 NOTE — ED Notes (Signed)
Pt resting in bed watching TV. NAD noted. ?

## 2021-08-27 NOTE — ED Notes (Signed)
Patient's condom catheter dislodged and incontinent of urine in bed. Linens changed, pericare performed and dry brief applied. Positioned for comfort. No distress noted at this time. ?

## 2021-08-27 NOTE — ED Notes (Signed)
Pt mother told this tech pt was covered in urine. Jessica RN and I cleaned pt of urine, changed gown/chucks. Mother applied condom cath from home. ?

## 2021-08-27 NOTE — ED Notes (Signed)
This rn went into patients room to give medication and care-aid was in the room taking care of patient when this rn noticed an alcoholic beverage on the counter that the care aid was feeding to the patient previously. This rn notified the charge nurse and the beverage was removed from patients room.  ?

## 2021-08-27 NOTE — ED Notes (Signed)
Pharmacy messaged about 1000 meds being tubed up. ?

## 2021-08-27 NOTE — ED Notes (Signed)
Patient sitting upright in bed watching television. No distress noted at this time. ?

## 2021-08-27 NOTE — ED Notes (Signed)
Pharmacy messaged again regarding 1000 meds. Still have not received. ?

## 2021-08-27 NOTE — ED Notes (Signed)
Pt's aide in room had brought pt in a 20oz [employer] alcoholic drink, caregiver and pt were advised that alcohol is not allowed in the hospital per policy. Erie Noe RN was in room talking to pt and caregiver, pt had only taken a few sips and was discarded by this RN.  Caregiver was advised that no alcoholic beverages can be brought in for patients even for patients that are boarding in the ED.  ?

## 2021-08-28 DIAGNOSIS — Z741 Need for assistance with personal care: Secondary | ICD-10-CM | POA: Diagnosis not present

## 2021-08-28 NOTE — ED Notes (Signed)
Pt is sleeping comfortably in NAD. No needs identified at this time.  ?

## 2021-08-28 NOTE — TOC Progression Note (Signed)
Transition of Care (TOC) - Progression Note  ? ? ?Patient Details  ?Name: Derrick Jordan ?MRN: [id number] ?Date of Birth: 11-20-1985 ? ?Transition of Care (TOC) CM/SW Contact  ?Allayne Butcher, RN ?Phone Number: ?08/28/2021, 5:58 PM ? ?Clinical Narrative:    ?Update from Corrie with [facility]:  There is a planning meeting on Monday 5/15 to complete a warm handoff between [facility] and [facility].  During this time we will also be reviewing potential discharge disposition planning. ? ? ?Expected Discharge Plan:  (IFL- or home with adequate home services) ?Barriers to Discharge: Other (must enter comment) (placement) ? ?Expected Discharge Plan and Services ?Expected Discharge Plan:  (IFL- or home with adequate home services) ?  ?Discharge Planning Services: CM Consult ?  ?Living arrangements for the past 2 months: Single Family Home ?                ?DME Arranged: N/A ?DME Agency: NA ?  ?  ?  ?  ?  ?  ?  ?  ? ? ?Social Determinants of Health (SDOH) Interventions ?  ? ?Readmission Risk Interventions ?   ? View : No data to display.  ?  ?  ?  ? ? ?

## 2021-08-28 NOTE — ED Notes (Signed)
Introduced self to pt for first time. Pt resting comfortably in bed at this time. Pt is alert and oriented with even and regular respirations. No acute distress noted. Pt denies any needs at this time. Call light within reach. Pt caregiver remains at bedside. ?

## 2021-08-28 NOTE — ED Notes (Signed)
Pt fed self for supper and requested black coffee. RN provided decaf coffee with a straw and advised pt to be very careful as coffee is hot. RN returned to the room to find that pt had spilled some food on his bed and the condom cath had come loose, soiling the bed. RN assisted NT with patient care, changing gown and complete linens. Provided new brief and replaced condom cath. Pt's health aide then entered the room for night shift and assisted with the remainder of pt care activities.  ?

## 2021-08-29 DIAGNOSIS — Z741 Need for assistance with personal care: Secondary | ICD-10-CM | POA: Diagnosis not present

## 2021-08-29 NOTE — ED Notes (Signed)
Pt cleaned up of urinary incontinence. Linens changed. New brief applied. Pt refused condom cath. Pt repositioned in bed, breakfast tray fixed for pt. Bed in lowest position and locked with bed rails up X 3. Bed alarm on. No S/S of distress noted.  ?

## 2021-08-29 NOTE — ED Notes (Addendum)
This RN entered room. Pts condom cath appears to have fallen off. RN attempts to put a new clean condom cath back on but pt refuses at this time. Pt is readjusted back in bed. Call light within reach. ?

## 2021-08-29 NOTE — ED Notes (Signed)
Pt cleaned up of urinary incontinence. Linens changed. Clean gown and brief placed on patient. Pt provided with coffee per his request. No further needs expressed at this time.  ?

## 2021-08-29 NOTE — ED Notes (Signed)
Brief changed. Pt gown changed d/t pt spilling coffee on it. Pt tolerated well.  ?

## 2021-08-29 NOTE — ED Notes (Signed)
Rounded on pt. He is awake and alert, resting comfortably. He denies pain and asked to watch tv. RN provided water and gatorade per pt request. Checked pt and condom cath is intact, pt and linens are clean and dry. No needs at this time.  ?

## 2021-08-29 NOTE — ED Provider Notes (Signed)
Today's Vitals  ? 08/28/21 0700 08/28/21 0924 08/28/21 1053 08/29/21 0154  ?BP:  118/82    ?Pulse:  63    ?Resp:  17    ?Temp:  98.2 ?F (36.8 ?C)    ?TempSrc:  Oral    ?SpO2:  99%    ?PainSc: Asleep  Asleep 0-No pain  ? ?There is no height or weight on file to calculate BMI. ? ? ?No acute events overnight.  Awaiting social work disposition. ?  ?Laney Bagshaw, Delice Bison, DO ?08/29/21 0305 ? ?

## 2021-08-30 DIAGNOSIS — Z741 Need for assistance with personal care: Secondary | ICD-10-CM | POA: Diagnosis not present

## 2021-08-30 NOTE — ED Notes (Signed)
Patient pulled up in bed and situated. Condom cath replaced after finding off patient.  ?

## 2021-08-30 NOTE — ED Provider Notes (Signed)
Today's Vitals  ? 08/29/21 0154 08/29/21 0810 08/29/21 2231 08/30/21 0006  ?BP:  122/83  (!) 128/94  ?Pulse:  70  79  ?Resp:  16  18  ?Temp:  98.1 ?F (36.7 ?C)  97.9 ?F (36.6 ?C)  ?TempSrc:  Oral  Oral  ?SpO2:  100%  98%  ?PainSc: 0-No pain 0-No pain 0-No pain 0-No pain  ? ?There is no height or weight on file to calculate BMI. ? ? ?No acute events overnight.  Awaiting social work disposition. ?  ?Garnell Begeman, [name], DO ?08/30/21 0758 ? ?

## 2021-08-30 NOTE — ED Notes (Signed)
Caregiver arrived at bedside. 

## 2021-08-31 DIAGNOSIS — Z741 Need for assistance with personal care: Secondary | ICD-10-CM | POA: Diagnosis not present

## 2021-08-31 NOTE — ED Notes (Signed)
Personal caregiver at bedside encouraging patient to eat breakfast. Per caregiver, pt allowed her to put condom cath on and he is clean and dry at this time.  ?

## 2021-08-31 NOTE — ED Notes (Signed)
Pt cleaned up of urinary incontinence. Clean brief placed on patient. Tolerated well. Pt provided with cup of coffee per request. ?

## 2021-08-31 NOTE — ED Notes (Signed)
Patient provided with fresh water to drink.  Lights in room dimmed for comfort.  Patient has no other complaints at this time ?

## 2021-08-31 NOTE — ED Notes (Signed)
Caregiver remains in room freshening pt up and repositioning patient. No needs expressed at this time.  ?

## 2021-08-31 NOTE — ED Notes (Signed)
Breakfast tray delivered to pt.

## 2021-08-31 NOTE — ED Notes (Signed)
Patient observed resting with eyes closed.  Respirations even and unlabored.  ?

## 2021-08-31 NOTE — ED Provider Notes (Signed)
----------------------------------------- ?  8:07 AM on 08/31/2021 ?----------------------------------------- ? ? ?Blood pressure 124/83, pulse 72, temperature 97.9 ?F (36.6 ?C), temperature source Oral, resp. rate 14, SpO2 99 %. ? ?The patient is calm and cooperative at this time.  There have been no acute events since the last update.  Awaiting disposition plan from [facility] team. ?  ?Loleta Rose, MD ?08/31/21 [phone] ? ?

## 2021-08-31 NOTE — ED Notes (Signed)
RN reposition pt in bed.  ?

## 2021-09-01 DIAGNOSIS — Z741 Need for assistance with personal care: Secondary | ICD-10-CM | POA: Diagnosis not present

## 2021-09-01 NOTE — ED Provider Notes (Signed)
----------------------------------------- ?  6:32 AM on 09/01/2021 ?----------------------------------------- ? ? ?Blood pressure 119/75, pulse 76, temperature 97.6 ?F (36.4 ?C), temperature source Oral, resp. rate 15, SpO2 97 %. ? ?The patient is calm and cooperative at this time.  There have been no acute events since the last update.  Awaiting disposition plan from [facility] team. ?  ?Loleta Rose, MD ?09/01/21 [phone] ? ?

## 2021-09-01 NOTE — ED Notes (Signed)
Requesting lights dimmed and door shut. ?

## 2021-09-01 NOTE — ED Notes (Signed)
Provided patient with coffee per request.

## 2021-09-01 NOTE — ED Notes (Signed)
Pt remains clean and dry at this time. Pt remains connected to condom cath ?

## 2021-09-01 NOTE — ED Notes (Signed)
Pt ate 50% of meal tray. Pt able to self feed. Hand hygiene provided. Pt now watching TV. ?

## 2021-09-01 NOTE — TOC Progression Note (Signed)
Transition of Care (TOC) - Progression Note  ? ? ?Patient Details  ?Name: Caelin Rayl ?MRN: [id number] ?Date of Birth: 06-04-1985 ? ?Transition of Care (TOC) CM/SW Contact  ?Allayne Butcher, RN ?Phone Number: ?09/01/2021, 4:39 PM ? ?Clinical Narrative:    ?Reached out to Corrie with [facility] for any updates on plan of care:  "There was a big meeting including care team staff from [facility] and [facility] staff.  Members current [facility] IDD team are working on attempting to find additional home care staff with an additional care staff provider for the Member to return home.  Details and logistics are still being worked out.  Members Mother seems hesitant to consider potential group home placements at this time." ? ? ?Expected Discharge Plan:  (IFL- or home with adequate home services) ?Barriers to Discharge: Other (must enter comment) (placement) ? ?Expected Discharge Plan and Services ?Expected Discharge Plan:  (IFL- or home with adequate home services) ?  ?Discharge Planning Services: CM Consult ?  ?Living arrangements for the past 2 months: Single Family Home ?                ?DME Arranged: N/A ?DME Agency: NA ?  ?  ?  ?  ?  ?  ?  ?  ? ? ?Social Determinants of Health (SDOH) Interventions ?  ? ?Readmission Risk Interventions ?   ? View : No data to display.  ?  ?  ?  ? ? ?

## 2021-09-01 NOTE — ED Notes (Signed)
RN provided more water per patient request. Pt continues to be in bed watching TV. Condom cath in place, pt clean and dry. ?

## 2021-09-01 NOTE — ED Notes (Addendum)
Ate 100% of breakfast and 8oz PO fluid. ? ?Face cleaned and pt watching TV now ? ?

## 2021-09-01 NOTE — ED Notes (Signed)
Set up lunch for patient and stayed in room to assist as needed. Pt able to self feed and drink.  ?

## 2021-09-01 NOTE — ED Notes (Signed)
Pt resting comfortably in bed at this time. Pt readjusted in bed to a level of comfort and given water. Pt is alert and oriented with even and regular respirations. No acute distress noted. Pt denies any needs at this time. Call light within reach. Caregiver remains at bedside  ?

## 2021-09-01 NOTE — ED Notes (Signed)
RN at bedside to help pt prepare to self-feed. Pt clean and dry. Condom cath is positioned appropriately. ?

## 2021-09-01 NOTE — ED Notes (Signed)
Patient given more water. Pt clean and dry. Watching TV at this time. ?

## 2021-09-02 DIAGNOSIS — Z741 Need for assistance with personal care: Secondary | ICD-10-CM | POA: Diagnosis not present

## 2021-09-02 NOTE — ED Notes (Signed)
Pt resting comfortable. Pt eating breakfast. Visitor bedside assisting patient.  ?

## 2021-09-02 NOTE — ED Notes (Signed)
Pt resting in bed. Appears to be sleeping at this time. Chest is rising and falling symmetrically. No acute distress noted.  ?

## 2021-09-02 NOTE — ED Provider Notes (Signed)
----------------------------------------- ?  5:28 AM on 09/02/2021 ?----------------------------------------- ? ? ?Blood pressure 120/63, pulse 74, temperature 97.8 ?F (36.6 ?C), temperature source Oral, resp. rate 14, SpO2 98 %. ? ?The patient is calm and cooperative at this time.  There have been no acute events since the last update.  Awaiting disposition plan from Social Work team. ?  ?Irean Hong, MD ?09/02/21 [phone] ? ?

## 2021-09-03 DIAGNOSIS — Z741 Need for assistance with personal care: Secondary | ICD-10-CM | POA: Diagnosis not present

## 2021-09-03 NOTE — ED Notes (Signed)
Patient resting in bed, watching television Denies pain or needs. Resp even, unlabored on RA. External urinary catheter in place. No distress noted at this time.  ?

## 2021-09-03 NOTE — ED Notes (Signed)
Pt cleaned up with brief changed by this writer and Mel, NT. ?

## 2021-09-03 NOTE — ED Notes (Signed)
Pt had large soft brown bowel movement.  Pt cleaned and changed,.  Male purewic catheter in place.      ?

## 2021-09-03 NOTE — ED Notes (Signed)
Fed pt 1 cup of applesauce, pt appreciative. ?

## 2021-09-03 NOTE — ED Notes (Signed)
Pt eating lunch now.  Mother with pt  meds given .   ?

## 2021-09-03 NOTE — ED Notes (Signed)
Patient sitting upright in bed watching [employer] on television. Apple juice provided per request. Patient repositioned for comfort and 400 ml urine emptied from purewick. Denies additional needs. No distress noted.  ?

## 2021-09-04 DIAGNOSIS — Z741 Need for assistance with personal care: Secondary | ICD-10-CM | POA: Diagnosis not present

## 2021-09-04 NOTE — ED Provider Notes (Signed)
-----------------------------------------   6:41 AM on 09/04/2021 -----------------------------------------   Blood pressure 125/73, pulse 67, temperature 97.7 F (36.5 C), temperature source Oral, resp. rate 16, height 6\' 1"  (1.854 m), weight 90.7 kg, SpO2 99 %.  The patient is calm and cooperative at this time.  There have been no acute events since the last update.  Awaiting disposition plan from Social Work team.   , MD 09/04/21 [phone]

## 2021-09-04 NOTE — ED Notes (Signed)
Family at bedside. 

## 2021-09-04 NOTE — ED Notes (Signed)
Patient resting in bed with eyes closed. Resp even, unlabored on RA. No distress noted at this time. 

## 2021-09-04 NOTE — ED Notes (Signed)
Message sent to pharmacy for xarelto

## 2021-09-04 NOTE — TOC Progression Note (Signed)
Transition of Care [facility]) - Progression Note    Patient Details  Name: Derrick Jordan MRN: [id number] Date of Birth: 20-Nov-1985  Transition of Care [facility]) CM/SW Contact  Wellston Cellar, RN Phone Number: 09/04/2021, 2:39 PM  Clinical Narrative:    Received update from Corrie @ [facility] reports IDD coordinator is working to staff care. Four staff have been confirmed, one is waiting for her TB results and the other is completing orientation. IDD coordinator is working to find a back up provider as well.    Expected Discharge Plan:  (IFL- or home with adequate home services) Barriers to Discharge: Other (must enter comment) (placement)  Expected Discharge Plan and Services Expected Discharge Plan:  (IFL- or home with adequate home services)   Discharge Planning Services: CM Consult   Living arrangements for the past 2 months: Single Family Home                 DME Arranged: N/A DME Agency: NA                   Social Determinants of Health (SDOH) Interventions    Readmission Risk Interventions     View : No data to display.

## 2021-09-04 NOTE — ED Notes (Signed)
Patient provided with apple juice per request.  Adult brief dry and clean at this time.  Room straighten and patient provided with blanket for comfort

## 2021-09-04 NOTE — ED Notes (Signed)
Patient provided with black decaf coffee per request.  Coffee allowed to cool to room temperature prior to providing to patient.

## 2021-09-04 NOTE — ED Notes (Signed)
Checked pt's brief, brief clean and dry

## 2021-09-04 NOTE — ED Notes (Signed)
Caregiver at bedside giving pt bed bath

## 2021-09-04 NOTE — TOC Progression Note (Signed)
Transition of Care [facility]) - Progression Note    Patient Details  Name: Hyrum Shaneyfelt MRN: [id number] Date of Birth: 04-06-1986  Transition of Care [facility]) CM/SW Contact  Juniata Cellar, RN Phone Number: 09/04/2021, 9:39 AM  Clinical Narrative:    Outreach to Corrie @ [facility] requesting update on staffing in regards to patient returning home.    Malcolm Metro, BSSW, MS, MSW, LCSW, LCAS-A Acute Transitional Care Manager [facility]    [address] [place], [place] [address] [place] [phone]. [phone]  C [phone]  F [phone]  [email or web address]    Expected Discharge Plan:  (IFL- or home with adequate home services) Barriers to Discharge: Other (must enter comment) (placement)  Expected Discharge Plan and Services Expected Discharge Plan:  (IFL- or home with adequate home services)   Discharge Planning Services: CM Consult   Living arrangements for the past 2 months: Single Family Home                 DME Arranged: N/A DME Agency: NA                   Social Determinants of Health (SDOH) Interventions    Readmission Risk Interventions     View : No data to display.

## 2021-09-04 NOTE — ED Notes (Signed)
Patient incontinent of urine in bed. Pericare performed and linens changed. New primafit external catheter applied. No distress noted.

## 2021-09-05 DIAGNOSIS — Z741 Need for assistance with personal care: Secondary | ICD-10-CM | POA: Diagnosis not present

## 2021-09-05 NOTE — ED Notes (Signed)
This RN and EDT at bedside. Pt condom cath off and bed linens soiled. Pt cleaned. Clean and dry brief and sheets in place. New condom cath placed on pt. Mother at bedside.

## 2021-09-05 NOTE — ED Notes (Addendum)
RN and mother cleaned pt. External male catheter pulled off by pt. Condom cath placed on pt per mother's request.

## 2021-09-05 NOTE — ED Notes (Signed)
Pt pulled off condom catheter. Male purewick placed at this time

## 2021-09-05 NOTE — ED Notes (Signed)
Pt repositioned in bed and HOB set upright for pt to eat. Breakfast tray and coffee provided. Pt brief clean and dry, condom cath in place. Pt denies any further requests at this time.

## 2021-09-05 NOTE — ED Notes (Signed)
Patient appears to be sleeping at this time.  Eyes closed.  Respirations even and unlabored.

## 2021-09-05 NOTE — ED Notes (Signed)
Pt had BM. Pt cleaned by this RN and pt's mother. Condom cath pulled off by pt. Male external catheter in place with brief.

## 2021-09-05 NOTE — ED Notes (Addendum)
Pt repositioned in bed and given meal tray. Pt's brief clean and dry at this time.

## 2021-09-06 DIAGNOSIS — Z741 Need for assistance with personal care: Secondary | ICD-10-CM | POA: Diagnosis not present

## 2021-09-06 NOTE — ED Notes (Signed)
Patient helped with lunch tray. Patient ate 100% of tray

## 2021-09-06 NOTE — ED Notes (Addendum)
Patient soaked in urine. Condom cath off. Patient cleaned and purwick placed. Patient educated not to pull off purwick and sitting in urine can cause breakdown. Patient pulled up in bed and given water and coffee

## 2021-09-06 NOTE — ED Provider Notes (Signed)
Emergency Medicine Observation Re-evaluation Note  Derrick Jordan is a 36 y.o. male currently awaiting placement to an appropriate living facility. No acute events over night or during my shift.    Physical Exam  BP 136/78   Pulse 88   Temp 97.7 F (36.5 C)   Resp 15   Ht 6\' 1"  (1.854 m)   Wt 90.7 kg   SpO2 97%   BMI 26.39 kg/m    ED Course / MDM   No recent labs for review.  Plan  Current plan is for placement to an appropriate living facility once available.  Derrick Jordan is not under involuntary commitment.     [name], MD 09/06/21 [phone]

## 2021-09-06 NOTE — ED Notes (Addendum)
Patients mom at bedside. Patients mom given animals that were saturated in urine to wash. Patient had pulled off purwick. New purwick placed on patient at this time. Patients linens dry at this time

## 2021-09-06 NOTE — ED Notes (Signed)
Pts bedding and brief changed. New condom cath placed. Pt continues to pull brief, condom cath and/or pure wick off as well as the bedding and urinates all over the bed multiple times a day. Pt can handle and hold cups and has a large insulated cup at bedside he can hold with no distress. Pt was educated on the risk of laying in urine multiple times a day and the skin breakdown that can follow due to this.  Tech fed pt 100% of breakfast meal and pt drank 100% of water cup, juice cup and coffee cup.  Pt is calm and watching TV in bed at this time.

## 2021-09-07 DIAGNOSIS — Z741 Need for assistance with personal care: Secondary | ICD-10-CM | POA: Diagnosis not present

## 2021-09-07 NOTE — ED Notes (Signed)
Pt changed into new sheets, new brief, new purewick. Breakfast set up for pt and pt assisted in eating. TV turned on for pt. New warm blankets given to pt.

## 2021-09-07 NOTE — ED Notes (Signed)
Caregiver assisting pt to eat. Changing bedsheet due to urine. Denies other needs.

## 2021-09-07 NOTE — ED Notes (Signed)
Pt had BM. Caregiver cleaned pt up.

## 2021-09-07 NOTE — ED Notes (Signed)
Caregiver at bedside. Pt given coffee.

## 2021-09-08 DIAGNOSIS — Z741 Need for assistance with personal care: Secondary | ICD-10-CM | POA: Diagnosis not present

## 2021-09-08 LAB — CBC
HCT: 44.9 % (ref 39.0–52.0)
Hemoglobin: 14.3 g/dL (ref 13.0–17.0)
MCH: 27.8 pg (ref 26.0–34.0)
MCHC: 31.8 g/dL (ref 30.0–36.0)
MCV: 87.2 fL (ref 80.0–100.0)
Platelets: 281 10*3/uL (ref 150–400)
RBC: 5.15 MIL/uL (ref 4.22–5.81)
RDW: 12.7 % (ref 11.5–15.5)
WBC: 6.6 10*3/uL (ref 4.0–10.5)
nRBC: 0 % (ref 0.0–0.2)

## 2021-09-08 NOTE — ED Notes (Signed)
Pt keeps pulling condom cath off. just placed forth condom cath since my shift started at 0700. Pt made aware that this was the last one and if he pulls it off then he will be wet until I can change him. Pt was wet and was changed.

## 2021-09-08 NOTE — ED Provider Notes (Signed)
-----------------------------------------   5:47 AM on 09/08/2021 -----------------------------------------   Blood pressure 128/82, pulse 92, temperature 97.7 F (36.5 C), temperature source Oral, resp. rate 16, height 6\' 1"  (1.854 m), weight 90.7 kg, SpO2 98 %.  The patient is calm and cooperative at this time.  There have been no acute events since the last update.  Awaiting disposition plan from Social Work team.   , MD 09/08/21 [phone]

## 2021-09-08 NOTE — ED Notes (Signed)
Report off to [employer]

## 2021-09-08 NOTE — TOC Progression Note (Signed)
Transition of Care [facility]) - Progression Note    Patient Details  Name: Derrick Jordan MRN: [id number] Date of Birth: 18-Jan-1986  Transition of Care [facility]) CM/SW Contact  Shelbie Hutching, RN Phone Number: 09/08/2021, 12:57 PM  Clinical Narrative:    Patient has been here for 22 days so far.  RNCM reached out to [facility] and requested update.  If they can't find at home care then we need to start looking at placement.  RNCM will start bed search tomorrow for SNF if [facility] and mother cannot get home care.    Expected Discharge Plan:  (IFL- or home with adequate home services) Barriers to Discharge: Other (must enter comment) (placement)  Expected Discharge Plan and Services Expected Discharge Plan:  (IFL- or home with adequate home services)   Discharge Planning Services: CM Consult   Living arrangements for the past 2 months: Single Family Home                 DME Arranged: N/A DME Agency: NA                   Social Determinants of Health (SDOH) Interventions    Readmission Risk Interventions     View : No data to display.

## 2021-09-08 NOTE — ED Notes (Signed)
Pt alert, meds given.   

## 2021-09-08 NOTE — ED Notes (Signed)
Introduced self to pt. Pt without condom cath or purewick. Pt sheets, gown wet. Pt, bed changed. Condom cath applied. Pt encouraged to keep cath on. Will monitor.

## 2021-09-08 NOTE — ED Notes (Addendum)
Resumed care from bill rn.  Pt alert,  mother with pt.  Meds given.

## 2021-09-09 DIAGNOSIS — Z741 Need for assistance with personal care: Secondary | ICD-10-CM | POA: Diagnosis not present

## 2021-09-09 NOTE — ED Notes (Signed)
Entered room to give 0800 meds. Pt is sleeping. Will allow pt to sleep and give 0800 meds once awake.

## 2021-09-09 NOTE — ED Notes (Signed)
Pt has water with straw within reach and is eating [place] sandwich and watching TV. No family at bedside at this time.

## 2021-09-09 NOTE — ED Notes (Signed)
Pt cleaned into new brief and cream applied to buttocks.

## 2021-09-09 NOTE — ED Notes (Signed)
Pt given meal tray.

## 2021-09-09 NOTE — ED Notes (Signed)
Pt brief cleaned and changed with 2nd rn

## 2021-09-09 NOTE — ED Notes (Signed)
Mother of pt is at bedside. Informed that peri care was done, condom cath placed, full linen change etc. Chocolate ice cream provided to mother of pt to feed to pt.

## 2021-09-09 NOTE — ED Notes (Signed)
Pts brief checked - dry at this time. No needs expressed at this time.

## 2021-09-09 NOTE — ED Notes (Signed)
Pt brief changed, and condom cath replaced.

## 2021-09-09 NOTE — ED Notes (Signed)
Helping pt to eat breakfast. Pt ate eggs, bacon, and is feeding self whole banana. Pt drank coffee that came on tray and asked for more. Provided another cup of coffee with 1/2 pack sugar to pt.

## 2021-09-09 NOTE — TOC Progression Note (Signed)
Transition of Care [facility]) - Progression Note    Patient Details  Name: Derrick Jordan MRN: [id number] Date of Birth: [birth year]  Transition of Care [facility]) CM/SW Contact  Shelbie Hutching, RN Phone Number: 09/09/2021, 4:54 PM  Clinical Narrative:    Met with patient's mother at the bedside this afternoon, she reports diligently working on filling caregiver slots.  She has been in contact with [employer] and they are doing interviews.  Waiting to hear from [facility] for updates.     Expected Discharge Plan:  (IFL- or home with adequate home services) Barriers to Discharge: Other (must enter comment) (placement)  Expected Discharge Plan and Services Expected Discharge Plan:  (IFL- or home with adequate home services)   Discharge Planning Services: CM Consult   Living arrangements for the past 2 months: Single Family Home                 DME Arranged: N/A DME Agency: NA                   Social Determinants of Health (SDOH) Interventions    Readmission Risk Interventions     View : No data to display.

## 2021-09-09 NOTE — ED Notes (Signed)
Pt continues to sleep. No family at bedside at this time.

## 2021-09-09 NOTE — ED Notes (Signed)
Pt asked for TV on to channel 2. Turned on TV and provided remote to pt. Pt is able to communicate his needs.

## 2021-09-10 DIAGNOSIS — Z741 Need for assistance with personal care: Secondary | ICD-10-CM | POA: Diagnosis not present

## 2021-09-10 NOTE — ED Provider Notes (Signed)
Today's Vitals   09/10/21 0237 09/10/21 0340 09/10/21 0408 09/10/21 0551  BP:      Pulse:      Resp:      Temp:      TempSrc:      SpO2:      Weight:      Height:      PainSc: Asleep Asleep Asleep Asleep   Body mass index is 26.39 kg/m.   No acute events overnight.  Awaiting social work disposition.   Derrick Jordan, [name], DO 09/10/21 [phone]

## 2021-09-10 NOTE — ED Notes (Signed)
Pt pulled condom cath off and pt's face covered in cake. Pt cleaned, gown changed, incontinence pad and brief changed. Pt does not want condom cath on at this time.

## 2021-09-10 NOTE — ED Notes (Signed)
Derrick Jordan, NT and [place], NT full linen change at this time. Cleaned, dried, brief pad and sheets changed at this time. Pt is refusing condom cath and states he only wants to wear a brief.

## 2021-09-10 NOTE — ED Notes (Signed)
Caregiver at bedside

## 2021-09-10 NOTE — ED Notes (Addendum)
Pt incontinent of urine. Pt gown and incontinence pads changed. Peri care performed. Condom cath applied with drainage bag.

## 2021-09-11 DIAGNOSIS — Z741 Need for assistance with personal care: Secondary | ICD-10-CM | POA: Diagnosis not present

## 2021-09-11 NOTE — ED Notes (Signed)
Pt pulled off condom catheter. This RN and Tech, Delaney Meigs cleaned pt, placed clean brief and linen. Attempted to place purewick, pt pulling at [facility] and not letting tech and rn put purewick.

## 2021-09-11 NOTE — ED Provider Notes (Signed)
-----------------------------------------   2:54 AM on 09/11/2021 -----------------------------------------   Blood pressure 132/85, pulse 82, temperature (!) 97.4 F (36.3 C), temperature source Axillary, resp. rate 16, height 1.854 m (6\' 1" ), weight 90.7 kg, SpO2 100 %.  The patient is calm and cooperative at this time, currently sleeping.  There have been no acute events since the last update.  Awaiting disposition plan from [facility] team.   [facility], MD 09/11/21 [phone]

## 2021-09-12 DIAGNOSIS — Z741 Need for assistance with personal care: Secondary | ICD-10-CM | POA: Diagnosis not present

## 2021-09-12 NOTE — ED Notes (Signed)
Pt face cleaned from his eating. Pts bed changed. Pt keeps taking Purewick off. Bed has been changed multiple times today.

## 2021-09-12 NOTE — ED Notes (Signed)
Report received by previous RN. Pt currently awake sitting up in his hospital bed watching tv. Denies any needs. Call bed within reach.

## 2021-09-12 NOTE — ED Notes (Addendum)
Pt sleeping comfortably. Bed appears to be dry. Male purfit in place. Bedside table and call bell within reach. Will hold VS collection while pt is resting

## 2021-09-12 NOTE — ED Notes (Signed)
Pts mom st bedside. Pt had urinated on bed. Pt is changed and cleaned up. Condom catheter placed on pt.

## 2021-09-12 NOTE — ED Notes (Signed)
Pt caregiver at bedside 

## 2021-09-12 NOTE — ED Notes (Signed)
This RN and Les, Medic at bedside. Pt with soiled linens and brief and external catheter off. Pt cleaned. Clean brief and linens in place. New external catheter in place. Pt repositioned in bed to take PO medication and breakfast tray sat at bedside.

## 2021-09-13 DIAGNOSIS — Z741 Need for assistance with personal care: Secondary | ICD-10-CM | POA: Diagnosis not present

## 2021-09-13 NOTE — ED Notes (Addendum)
Pt external catheter on the floor, pt's bed wet with urine. Pt and bedding changed and cleaned. Condom cath replaced. Pt set up for breakfast with assistance.

## 2021-09-13 NOTE — ED Notes (Signed)
Pt set up for lunch by personal staff, personal staff assisting with feeding.

## 2021-09-13 NOTE — ED Notes (Signed)
Care giver in room with pt.

## 2021-09-13 NOTE — ED Notes (Signed)
Pt eating breakfast 

## 2021-09-13 NOTE — ED Notes (Signed)
Pt's oral care done by personal aide after lunch, pt ate 100% of lunch with set up and some assist.

## 2021-09-13 NOTE — ED Notes (Signed)
This RN did a full lien changed. Pericare was done. Pt placed in new gown and new condom cath.

## 2021-09-13 NOTE — ED Provider Notes (Signed)
-----------------------------------------   5:25 AM on 09/13/2021 -----------------------------------------   Blood pressure 133/76, pulse 82, temperature 98.6 F (37 C), temperature source Axillary, resp. rate 20, height 6\' 1"  (1.854 m), weight 90.7 kg, SpO2 97 %.  The patient is calm and cooperative at this time.  There have been no acute events since the last update.  Awaiting disposition plan from Social Work team.   Paulette Blanch, MD 09/13/21 [phone]

## 2021-09-14 DIAGNOSIS — Z741 Need for assistance with personal care: Secondary | ICD-10-CM | POA: Diagnosis not present

## 2021-09-14 NOTE — ED Notes (Signed)
Condom cath pulled off, mother replaced. This RN changed pads under pt.

## 2021-09-14 NOTE — ED Notes (Signed)
RN found pt's bed linen to be soiled. This RN did a full bed linen change. Pt was placed in new brief and gown.

## 2021-09-14 NOTE — ED Notes (Signed)
Pt aid changed pt gown and sheets.

## 2021-09-14 NOTE — ED Notes (Signed)
Pt care giver shaved pt and gave pt bed bath.

## 2021-09-14 NOTE — ED Notes (Signed)
TOC Boarder Placement vs Home care

## 2021-09-14 NOTE — ED Provider Notes (Signed)
    09/14/2021    8:26 AM 09/13/2021    9:28 PM 09/13/2021    5:16 PM  Vitals with BMI  Systolic 123 119 811  Diastolic 74 81 73  Pulse 79 86 87    No change in the last 24 hours.  Patient is pending social work placement.   Georga Hacking, MD 09/14/21 [phone]

## 2021-09-15 DIAGNOSIS — Z741 Need for assistance with personal care: Secondary | ICD-10-CM | POA: Diagnosis not present

## 2021-09-15 LAB — CBC
HCT: 44.5 % (ref 39.0–52.0)
Hemoglobin: 14.1 g/dL (ref 13.0–17.0)
MCH: 27.9 pg (ref 26.0–34.0)
MCHC: 31.7 g/dL (ref 30.0–36.0)
MCV: 87.9 fL (ref 80.0–100.0)
Platelets: 249 10*3/uL (ref 150–400)
RBC: 5.06 MIL/uL (ref 4.22–5.81)
RDW: 12.7 % (ref 11.5–15.5)
WBC: 5.5 10*3/uL (ref 4.0–10.5)
nRBC: 0 % (ref 0.0–0.2)

## 2021-09-15 MED ORDER — GABAPENTIN 100 MG PO CAPS
200.0000 mg | ORAL_CAPSULE | Freq: Every day | ORAL | Status: DC
  Administered 2021-09-15 – 2021-12-14 (×88): 200 mg via ORAL
  Filled 2021-09-15 (×90): qty 2

## 2021-09-15 MED ORDER — GABAPENTIN 100 MG PO CAPS
100.0000 mg | ORAL_CAPSULE | Freq: Two times a day (BID) | ORAL | Status: DC
  Administered 2021-09-16 – 2021-12-15 (×175): 100 mg via ORAL
  Filled 2021-09-15 (×177): qty 1

## 2021-09-15 NOTE — ED Notes (Signed)
Brief placed on pt, condom cath in place. Provided warm blankets. Repositioned in bed.

## 2021-09-15 NOTE — ED Notes (Signed)
Patient brief changed at this time.

## 2021-09-15 NOTE — ED Notes (Signed)
Pt awake and alert. Breakfast tray setup. Pt states that he can feed self and is doing so. Will monitor and help as needed.

## 2021-09-15 NOTE — ED Notes (Signed)
Pt's mom at bedside.

## 2021-09-15 NOTE — TOC Progression Note (Signed)
Transition of Care [facility]) - Progression Note    Patient Details  Name: Derrick Jordan MRN: [id number] Date of Birth: [birth year]  Transition of Care [facility]) CM/SW Contact  Joseph Art, [place] Phone Number: [phone] 09/15/2021, 9:35 AM  Clinical Narrative:     Patient's mother [name] (Mother) [phone] states she is diligently working on filling caregiver slots.  She has been in contact with [facility] and they are doing interviews for caregiver assistance.  Waiting to hear from [facility] for updates.    Expected Discharge Plan:  (IFL- or home with adequate home services) Barriers to Discharge: Other (must enter comment) (placement)  Expected Discharge Plan and Services Expected Discharge Plan:  (IFL- or home with adequate home services)   Discharge Planning Services: CM Consult   Living arrangements for the past 2 months: Single Family Home                 DME Arranged: N/A DME Agency: NA                   Social Determinants of Health (SDOH) Interventions    Readmission Risk Interventions     View : No data to display.

## 2021-09-15 NOTE — ED Notes (Signed)
Pts lunch tray setup.

## 2021-09-15 NOTE — ED Notes (Signed)
TOC  PLACEMENT 

## 2021-09-15 NOTE — ED Provider Notes (Signed)
-----------------------------------------   7:39 AM on 09/15/2021 -----------------------------------------   Blood pressure 128/72, pulse 81, temperature 98 F (36.7 C), temperature source Oral, resp. rate 16, height 1.854 m (6\' 1" ), weight 90.7 kg, SpO2 96 %.  The patient is calm and cooperative at this time.  There have been no acute events since the last update.  Awaiting disposition plan from [facility] team.   [facility], MD 09/15/21 [phone]

## 2021-09-16 DIAGNOSIS — Z741 Need for assistance with personal care: Secondary | ICD-10-CM | POA: Diagnosis not present

## 2021-09-16 MED ORDER — LAMOTRIGINE 100 MG PO TABS: 200.0000 mg | ORAL_TABLET | Freq: Every morning | ORAL | Status: DC

## 2021-09-16 MED ORDER — IBANDRONATE SODIUM 150 MG PO TABS
150.0000 mg | ORAL_TABLET | ORAL | Status: DC
  Filled 2021-09-16: qty 1

## 2021-09-16 MED ORDER — LAMOTRIGINE 100 MG PO TABS
100.0000 mg | ORAL_TABLET | Freq: Every morning | ORAL | Status: DC
  Administered 2021-09-17 – 2021-12-15 (×83): 100 mg via ORAL
  Filled 2021-09-16 (×92): qty 1

## 2021-09-16 NOTE — ED Notes (Signed)
Pt given breakfast tray

## 2021-09-16 NOTE — ED Notes (Signed)
Pt brief and gown changed at this time 

## 2021-09-16 NOTE — ED Notes (Signed)
External cath applied per pt's mother's request; mother states she will talk pt into leaving it on; pt's mother specifically requesting it be attached to drainage bag and go at gravity rather than attaching to canister to suction.

## 2021-09-16 NOTE — ED Notes (Addendum)
Will provide pt with scheduled nasal spray found within pt's bedside belongings once pharm tech done reviewing meds with pt's mother as he is currently as bedside doing so. Pt's mother given contact info for SW as requested by SW staff.

## 2021-09-16 NOTE — ED Notes (Signed)
Pt's mother at bedside.

## 2021-09-16 NOTE — ED Notes (Addendum)
Messaged pharm to adjust time to give buspar as last given at 0859 and to be given 2 times daily. Pt eating from lunch tray; pt assisted to peel a banana as requested.

## 2021-09-16 NOTE — ED Notes (Signed)
Provider Darnelle Catalan states he will adjust medication orders as needed since pt's mother states they are not all exactly the same as what he has previously been taking.

## 2021-09-16 NOTE — ED Notes (Signed)
Pt's tray set up for him and pt assisted with breakfast tray. Pt eating at this time.

## 2021-09-16 NOTE — TOC Progression Note (Signed)
Transition of Care [facility]) - Progression Note    Patient Details  Name: Derrick Jordan MRN: [id number] Date of Birth: 04-13-1986  [facility]) CM/SW Contact  [place] Cellar, RN Phone Number: 09/16/2021, 3:12 PM  Clinical Narrative:    Spoke to [name] (Mother)  @ [phone] regarding her concerns with patient still being in hospital ED. Reports she was working with Michelle Nasuti @ [facility]  who has requested [place] @ [employer] invoke Appendix K which would allow him the opportunity to staff employees faster to assist with patient care. Mother reports she has already provided the staff members and is frustrated with the request from [facility] for them to redo all the new hire requirements that they had already completed with [facility]. She reports that Appendix K would allow him to bypass those repetitive requirements. Mother reports that she cares for patient 12 hours at night and needs workers for 12 hours during the day. Prior to this admission mother was paying private pay for workers however has now been granted innovations waiver to cover cost. [facility] contact is [place]ta [place] @ [phone] or mobile [phone] and [facility] contact is [place] @ [phone].    Expected Discharge Plan:  (IFL- or home with adequate home services) Barriers to Discharge: Other (must enter comment) (placement)  Expected Discharge Plan and Services Expected Discharge Plan:  (IFL- or home with adequate home services)   Discharge Planning Services: CM Consult   Living arrangements for the past 2 months: Single Family Home                 DME Arranged: N/A DME Agency: NA                   Social Determinants of Health (SDOH) Interventions    Readmission Risk Interventions     View : No data to display.

## 2021-09-16 NOTE — ED Notes (Signed)
Messaged pharm as missing dose of nasal spray; will assess bedside again to see if dose mixed in with pt's belongings.

## 2021-09-16 NOTE — ED Notes (Signed)
Pt wet with urine. Peri care provided, bed pads and briefs changed, attempted external male cath but pt kept pulling at it even after multiple education efforts; call bell within reach; pt educated about call bell; stretcher locked low. Pt denies pain.

## 2021-09-16 NOTE — ED Notes (Addendum)
SH with SW notified via secure chat that pt's mother requesting to talk with her.

## 2021-09-16 NOTE — ED Notes (Signed)
TOC placement 

## 2021-09-16 NOTE — ED Notes (Signed)
Pt's mother requesting to talk with SW; Network engineer notified to contact SW for her; pt's mother also requesting to have medications adjusted as states some are not ordered correctly; notified pharm tech in person of pt's mothers' request; pharm tech states he will talk with her soon.

## 2021-09-17 DIAGNOSIS — Z741 Need for assistance with personal care: Secondary | ICD-10-CM | POA: Diagnosis not present

## 2021-09-17 NOTE — ED Notes (Signed)
New condom cath attached to pt. Pt remains dry and clean at this time. Set up for breakfast. Hand hygiene provided.

## 2021-09-17 NOTE — TOC Progression Note (Signed)
Transition of Care [facility]) - Progression Note    Patient Details  Name: Derrick Jordan MRN: [id number] Date of Birth: 08/12/1985  Transition of Care [facility]) CM/SW Contact  Allayne Butcher, RN Phone Number: 09/17/2021, 3:31 PM  Clinical Narrative:    Received a call from Conrads mother this afternoon.  Mother reports that she is concerned for [name] because they have taken his purewick off and put him in diapers and he is not getting his bowel regimen.   RNCM did explain that this is an emergency department and those things are not priorities.  The emergency room will take care of the patient but they cannot be in there all the time and provide extra attention, the emergency room is for emergencies. Asked her if she wanted me to start looking for SNF because she was talking about how bad her hips were and she couldn't even walk today, she immediately said she needed to get off the phone and that she couldn't talk about that right now.  Mother reports that she wants to find a home for [name] to go with the Waiver not a SNF.  TOC will follow up with [facility] tomorrow.    Expected Discharge Plan:  (IFL- or home with adequate home services) Barriers to Discharge: Other (must enter comment) (placement)  Expected Discharge Plan and Services Expected Discharge Plan:  (IFL- or home with adequate home services)   Discharge Planning Services: CM Consult   Living arrangements for the past 2 months: Single Family Home                 DME Arranged: N/A DME Agency: NA                   Social Determinants of Health (SDOH) Interventions    Readmission Risk Interventions     View : No data to display.

## 2021-09-17 NOTE — ED Notes (Signed)
Caregiver denies further needs. Leaving and another caregiver coming around 5.

## 2021-09-17 NOTE — ED Notes (Signed)
TOC placement 

## 2021-09-17 NOTE — ED Notes (Signed)
Helped caregiver clean up pt.

## 2021-09-17 NOTE — ED Notes (Signed)
Patient brief and gown changed at this time

## 2021-09-17 NOTE — ED Notes (Signed)
Pt ripped off condom cath. Cleaned of urine and new sheets, brief given.

## 2021-09-17 NOTE — ED Notes (Signed)
Pt has caregiver at bedside. Ate 100 of lunch. Given coffee. Denies further needs.

## 2021-09-17 NOTE — ED Notes (Signed)
Patient provided with warm blanket per request.  

## 2021-09-18 DIAGNOSIS — Z741 Need for assistance with personal care: Secondary | ICD-10-CM | POA: Diagnosis not present

## 2021-09-18 DIAGNOSIS — R339 Retention of urine, unspecified: Secondary | ICD-10-CM | POA: Diagnosis not present

## 2021-09-18 DIAGNOSIS — Z20822 Contact with and (suspected) exposure to covid-19: Secondary | ICD-10-CM | POA: Diagnosis not present

## 2021-09-18 NOTE — ED Notes (Signed)
Report received. Pt resting on stretcher

## 2021-09-18 NOTE — ED Notes (Signed)
Meal tray given to pt.

## 2021-09-19 DIAGNOSIS — Z741 Need for assistance with personal care: Secondary | ICD-10-CM | POA: Diagnosis not present

## 2021-09-19 NOTE — ED Notes (Signed)
Pt given warm blanket.

## 2021-09-19 NOTE — ED Notes (Signed)
Pt appears to be resting comfortable awake and alert, Mother at the bedside, even RR and unlabored, NAD noted, call bell in reach, side rails up x2 for safety, care on going, will continue to monitor.

## 2021-09-19 NOTE — ED Notes (Signed)
Pt appears to be sleeping, even RR and unlabored, NAD noted, call bell in reach, side rails up x2 for safety, care on going, will continue to monitor. 

## 2021-09-19 NOTE — ED Notes (Signed)
Pt settle in for the night, adjusted in bed, reassess vitals (WNL), pt requested temp to be set on 72, even RR and unlabored, skin warm and dry. Bed linens dry and unfilth at this time. Safety in place, call bell in reach, will continue to monitor.

## 2021-09-19 NOTE — ED Notes (Signed)
Pt's mother has went home for the evening

## 2021-09-19 NOTE — ED Provider Notes (Signed)
Emergency Medicine Observation Re-evaluation Note  Derrick Jordan is a 36 y.o. male, remains in the emergency department currently awaiting social work for placement into an appropriate living facility.  Physical Exam  BP 111/75   Pulse 90   Temp 98.3 F (36.8 C) (Oral)   Resp 18   Ht 6\' 1"  (1.854 m)   Wt 90.7 kg   SpO2 97%   BMI 26.39 kg/m   No acute events overnight or during my shift today.  ED Course / MDM   Last lab work performed 5/29 showed a reassuring/normal CBC.  Plan  Current plan is for placement to department living facility once available.  Derrick Jordan is not under involuntary commitment.     [name], MD 09/19/21 2135

## 2021-09-19 NOTE — ED Notes (Signed)
Pt changed in new gown. Clean sheets on bed. Reapplied condom cath. Family at bedside

## 2021-09-19 NOTE — ED Provider Notes (Signed)
-----------------------------------------   4:39 AM on 09/19/2021 -----------------------------------------   Blood pressure (!) 107/94, pulse 84, temperature 97.8 F (36.6 C), temperature source Oral, resp. rate 18, height 1.854 m (6\' 1" ), weight 90.7 kg, SpO2 94 %.  The patient is calm and cooperative at this time.  There have been no acute events since the last update.  Awaiting disposition plan from [facility] team.   [facility], MD 09/19/21 [phone]

## 2021-09-20 DIAGNOSIS — Z741 Need for assistance with personal care: Secondary | ICD-10-CM | POA: Diagnosis not present

## 2021-09-20 NOTE — ED Notes (Signed)
Pt continues to sleep, even RR and unlabored, NAD noted, call bell in reach, side rails up x2 for safety, care on going, will continue to monitor.  

## 2021-09-20 NOTE — ED Notes (Signed)
Pt watching TV quietly in bed at this time. Brief clean and dry. No S/S of distress noted.

## 2021-09-20 NOTE — ED Notes (Signed)
Pt cleaned up of urinary incontinence. Linens and gown changed. New brief placed on patient. Primofit placed on patient. Fresh warm blankets provided to patient.

## 2021-09-20 NOTE — ED Notes (Signed)
Pt continues to sleep, even RR and unlabored, skin warm and dry, NAD noted, call bell in reach, side rails up x2 for safety, care on going, will continue to monitor.  

## 2021-09-20 NOTE — ED Notes (Signed)
Pt continues to sleep, even RR and unlabored, skin warm and dry, NAD noted, call bell in reach, side rails up x2 for safety, care on going, will continue to monitor.

## 2021-09-20 NOTE — ED Notes (Signed)
Caregiver at bedside giving patient a bed bath.

## 2021-09-21 DIAGNOSIS — Z741 Need for assistance with personal care: Secondary | ICD-10-CM | POA: Diagnosis not present

## 2021-09-21 NOTE — ED Notes (Signed)
TOC still continues to work with mother about a suitable place for placement

## 2021-09-21 NOTE — ED Notes (Addendum)
Pt resting in bed. Appears to be sleeping at this time. Chest is rising and falling symmetrically. Bed appears clean and dry at this time.  No acute distress noted.

## 2021-09-21 NOTE — ED Notes (Signed)
RN to bedside to introduce self to pt. Pt sleeping.  

## 2021-09-22 DIAGNOSIS — Z741 Need for assistance with personal care: Secondary | ICD-10-CM | POA: Diagnosis not present

## 2021-09-22 NOTE — ED Notes (Signed)
Pt brief, sheets, and gown changed at this time 

## 2021-09-22 NOTE — ED Provider Notes (Signed)
-----------------------------------------   6:21 AM on 09/22/2021 -----------------------------------------   Blood pressure 118/78, pulse 75, temperature 98.2 F (36.8 C), temperature source Axillary, resp. rate 16, height 6\' 1"  (1.854 m), weight 90.7 kg, SpO2 98 %.  The patient is calm and cooperative at this time.  There have been no acute events since the last update.  Awaiting disposition plan from Social Work team.   , MD 09/22/21 [phone]

## 2021-09-22 NOTE — ED Provider Notes (Signed)
-----------------------------------------   4:11 PM on 09/22/2021 ----------------------------------------- Patient's mother stated to staff that his baclofen pump is getting ready to run out and he had an appointment scheduled for tomorrow at [facility] to have it refilled.  She states that it will be, critically low on June 13 and needs to be refilled by then.  If he were to run out of baclofen, it does become a significant problem due to risk of baclofen withdrawal.  I reached out to [facility] transfer center given that is where patient has establish care, however they are currently on diversion due to capacity.  Patient originally had the baclofen pump placed at [facility] and I have reached out to the [facility] transfer center for assistance.  Awaiting callback at this time.   Chesley Noon, MD 09/22/21 [phone]

## 2021-09-22 NOTE — ED Notes (Signed)
Called [facility] for possible transfer spoke with Maralyn Sago @2 :45pm

## 2021-09-22 NOTE — ED Notes (Signed)
Called [facility] for update on transfer and still trying to touch base with a provider

## 2021-09-22 NOTE — TOC Progression Note (Signed)
Transition of Care [facility]) - Progression Note    Patient Details  Name: Derrick Jordan MRN: [id number] Date of Birth: Jun 26, 1985  Transition of Care [facility]) CM/SW Contact  Shelbie Hutching, RN Phone Number: 09/22/2021, 9:08 AM  Clinical Narrative:    Reached out to [facility] Team for update on placement efforts - last conversation with the patient's mother she wanted to find a home for patient to go into with his [employer] wavier.  She will be unable to take care of him at home with her hip problems, she said last week she could not even walk on Thursday.  Secure emailed [facility] team.    Expected Discharge Plan:  (IFL- or home with adequate home services) Barriers to Discharge: Other (must enter comment) (placement)  Expected Discharge Plan and Services Expected Discharge Plan:  (IFL- or home with adequate home services)   Discharge Planning Services: CM Consult   Living arrangements for the past 2 months: Single Family Home                 DME Arranged: N/A DME Agency: NA                   Social Determinants of Health (SDOH) Interventions    Readmission Risk Interventions     View : No data to display.

## 2021-09-23 DIAGNOSIS — Z741 Need for assistance with personal care: Secondary | ICD-10-CM | POA: Diagnosis not present

## 2021-09-23 NOTE — ED Provider Notes (Signed)
[facility] transfer center called back. They said that because patient has been here for so long, that they need to perform an Administrative review before they can even place patient on wait list for transfer. I provided them with info about the patient which will be given to the Hospitalist service to initiate the process.    Don Perking, [place], MD 09/23/21 [phone]

## 2021-09-23 NOTE — ED Notes (Signed)
[facility] contacted about wait listing pt and states that pt will not be wait listed as they are closed for medical transfers, but they will reopen this case for further review per Derrick Jordan in the transfer center

## 2021-09-23 NOTE — TOC Progression Note (Signed)
Transition of Care [facility]) - Progression Note    Patient Details  Name: Derrick Jordan MRN: [id number] Date of Birth: March 19, 1986  Transition of Care [facility]) CM/SW Contact  [place] Cellar, RN Phone Number: 09/23/2021, 12:46 PM  Clinical Narrative:    Received VM from mother letting hospital know that she was meeting with an attorney to try and get some assistance with patients special needs at home. No new requests for [facility] or hospital staff.    Expected Discharge Plan:  (IFL- or home with adequate home services) Barriers to Discharge: Other (must enter comment) (placement)  Expected Discharge Plan and Services Expected Discharge Plan:  (IFL- or home with adequate home services)   Discharge Planning Services: CM Consult   Living arrangements for the past 2 months: Single Family Home                 DME Arranged: N/A DME Agency: NA                   Social Determinants of Health (SDOH) Interventions    Readmission Risk Interventions     View : No data to display.

## 2021-09-23 NOTE — ED Notes (Signed)
Patient given coffee and water per request

## 2021-09-23 NOTE — TOC Progression Note (Addendum)
Transition of Care [facility]) - Progression Note    Patient Details  Name: Derrick Jordan MRN: [id number] Date of Birth: 14-Feb-1986  Transition of Care [facility]) CM/SW Contact  Anselm Pancoast, RN Phone Number: 09/23/2021, 9:58 AM  Clinical Narrative:    Received VM from mother expressing concern related to patient needing Baclofen pump refilled by 09/30/21. Confirmed providers are aware and have been working on possible options for transfer to [facility] or [facility]. Mother reports [facility] does not seem to have made any progress towards getting staffing and mother is having increased pain and worried she will not be able to care for patient once he is home. Understands that if patient is placed in facility he will lose his innovations waiver that pays for in home care. Discussed options related to mother paying private for patient to discharge home with her and she could transfer to [facility] or [facility] via ED for Baclofen pump care as well although this would not solve the long term care issue.    Call to [facility] requesting update on home staffing. Rayburn Ma reports case is being covered by IDD department and he will relay TOC contact info to appropriate person.   Expected Discharge Plan:  (IFL- or home with adequate home services) Barriers to Discharge: Other (must enter comment) (placement)  Expected Discharge Plan and Services Expected Discharge Plan:  (IFL- or home with adequate home services)   Discharge Planning Services: CM Consult   Living arrangements for the past 2 months: Single Family Home                 DME Arranged: N/A DME Agency: NA                   Social Determinants of Health (SDOH) Interventions    Readmission Risk Interventions     View : No data to display.

## 2021-09-23 NOTE — TOC Progression Note (Signed)
Transition of Care [facility]) - Progression Note    Patient Details  Name: Derrick Jordan MRN: [id number] Date of Birth: [birth year]  Transition of Care [facility]) CM/SW Contact  [place] Cellar, RN Phone Number: 09/23/2021, 11:47 AM  Clinical Narrative:    TOC presented case in DTP rounds and recommendation from physician advisor was to explore possibility of Baclofen pump being explored at [facility]. Outreached to EDP and ED treatment team who will follow up. Patient currently not on any wait list for transfer.   Expected Discharge Plan:  (IFL- or home with adequate home services) Barriers to Discharge: Other (must enter comment) (placement)  Expected Discharge Plan and Services Expected Discharge Plan:  (IFL- or home with adequate home services)   Discharge Planning Services: CM Consult   Living arrangements for the past 2 months: Single Family Home                 DME Arranged: N/A DME Agency: NA                   Social Determinants of Health (SDOH) Interventions    Readmission Risk Interventions     View : No data to display.

## 2021-09-23 NOTE — ED Notes (Signed)
Socks placed on patient 

## 2021-09-23 NOTE — ED Notes (Signed)
Jene Every, MD  Physician Emergency Medicine ED Provider Notes     Signed Date of Service:  09/23/2021 12:48 PM   Signed                              Difficult situation, deadline of 6/13 to have baclofen pump refilled to avoid severe baclofen withdrawal which is a potentially dangerous situation.  No one with expertise in baclofen pump refills is available here, hence we have been attempting transfer to [facility] or [facility].   [facility] has informed us they would not accept the transfer after administrative review.   Appreciate Dr. Selina Cooley and Osborne Oman assistance on this matter.  Dr. Marcell Barlow has discussed with functional neurosurgeon at [facility], they note they can do an ED to ED transfer on Thursday or Friday of this week because their PA that does baclofen refills (and knows the patient) will be available then.    I have informed [facility] that we will take the patient back after the baclofen pump refill given our case managers have been working intensively to find the patient placement.   Will discuss this with our CMO   Jene Every, MD 09/23/21 1251

## 2021-09-23 NOTE — ED Notes (Signed)
Patient ate 100% breakfast tray 

## 2021-09-23 NOTE — ED Notes (Signed)
Patient cleaned and brief, gown, and pads changed. Patient pulled up in bed

## 2021-09-23 NOTE — ED Provider Notes (Signed)
.       Sharyn Creamer, MD 09/23/21 1311

## 2021-09-23 NOTE — ED Provider Notes (Addendum)
[place] has communicated with [facility]. Patient is currently on WAITLIST for transfer request (but NOT accepted). [facility] on diversion. Remains on wait list.   Delman Kitten, MD 09/23/21 1006  ----------------------------------------- 10:29 AM on 09/23/2021 -----------------------------------------    [facility] advising they will have administrative review of this transfer request case occur. He is NOT on the waitlist at this point, but they are going to have admin review and will call back to [facility] ED once completed.    Delman Kitten, MD 09/23/21 1029

## 2021-09-23 NOTE — ED Notes (Signed)
Patient given coffee per request. 

## 2021-09-23 NOTE — ED Provider Notes (Signed)
Difficult situation, deadline of 6/13 to have baclofen pump refilled to avoid severe baclofen withdrawal which is a potentially dangerous situation.  No one with expertise in baclofen pump refills is available here, hence we have been attempting transfer to [facility] or [facility].  [facility] has informed us they would not accept the transfer after administrative review.  Appreciate Dr. Selina Cooley and Osborne Oman assistance on this matter.  Dr. Marcell Barlow has discussed with functional neurosurgeon at [facility], they note they can do an ED to ED transfer on Thursday or Friday of this week because their PA that does baclofen refills (and knows the patient) will be available then.   I have informed [facility] that we will take the patient back after the baclofen pump refill given our case managers have been working intensively to find the patient placement.  Will discuss this with our CMO   Jene Every, MD 09/23/21 1251

## 2021-09-23 NOTE — ED Notes (Signed)
Patient noted to have pulled off condom catheter and bed soaked in urine. Patients stuffed animal "hopper" covered in urine. Hopper placed in belongings bag to give to mother when she arrives to wash. When asked patient why he keeps pulling off his condom catheter he stated "because it makes my dick small."   When I placed hopper in belongings bag, patient started throwing his breakfast tray food at me.   Patient cleaned by this RN and [place]. Bedding, pads, brief, and gown changed. Patient hitting and kicking to not put condom catheter back on so patient was placed in a brief.   Once patient cleaned, patient set back up with breakfast tray and coffee placed in cup with straw. Door shut per patient request but window on door left open.

## 2021-09-24 DIAGNOSIS — Z741 Need for assistance with personal care: Secondary | ICD-10-CM | POA: Diagnosis not present

## 2021-09-24 NOTE — TOC Progression Note (Signed)
Transition of Care [facility]) - Progression Note    Patient Details  Name: Derrick Jordan MRN: [id number] Date of Birth: 1985/07/06  Transition of Care [facility]) CM/SW Contact  [place] Cellar, RN Phone Number: 09/24/2021, 10:54 AM  Clinical Narrative:    Updated Fl2 and requested medical notes sent to Susa Simmonds @ [facility] for potential AFL placement.    Expected Discharge Plan:  (IFL- or home with adequate home services) Barriers to Discharge: Other (must enter comment) (placement)  Expected Discharge Plan and Services Expected Discharge Plan:  (IFL- or home with adequate home services)   Discharge Planning Services: CM Consult   Living arrangements for the past 2 months: Single Family Home                 DME Arranged: N/A DME Agency: NA                   Social Determinants of Health (SDOH) Interventions    Readmission Risk Interventions     View : No data to display.

## 2021-09-24 NOTE — TOC Progression Note (Signed)
Transition of Care [facility]) - Progression Note    Patient Details  Name: Derrick Jordan MRN: [id number] Date of Birth: 1985/11/20  Transition of Care [facility]) CM/SW Contact  Derrick Cellar, RN Phone Number: 09/24/2021, 9:23 AM  Clinical Narrative:    Spoke at length with mother/guardian who reports she has been speaking with Derrick Jordan @ [facility] regarding possible AFL placement. Mother has made appointment for Baclofen refill on June 13 @ 11:30 at [facility].   TOC called and spoke with Derrick Jordan @ [facility] who is requesting Fl2 and supporting documents be emailed to [email or web address] in order to find placement.     Expected Discharge Plan:  (IFL- or home with adequate home services) Barriers to Discharge: Other (must enter comment) (placement)  Expected Discharge Plan and Services Expected Discharge Plan:  (IFL- or home with adequate home services)   Discharge Planning Services: CM Consult   Living arrangements for the past 2 months: Single Family Home                 DME Arranged: N/A DME Agency: NA                   Social Determinants of Health (SDOH) Interventions    Readmission Risk Interventions     View : No data to display.

## 2021-09-24 NOTE — ED Notes (Signed)
Pending Placement for pump refill

## 2021-09-24 NOTE — [place] FL2 (Signed)
[place] MEDICAID FL2 LEVEL OF CARE SCREENING TOOL     IDENTIFICATION  Patient Name: Derrick Jordan Birthdate: 04/19/1986 Sex: male Admission Date (Current Location): 08/16/2021  [place] and [place] Number:  Chiropodist and Address:  [facility], [address] [place], [place] [address]      Provider Number: [id number]  Attending Physician Name and Address:  No att. providers found  Relative Name and Phone Number:  Florene Glen [phone]    Current Level of Care: Other (Comment) (Group Home) Recommended Level of Care: Family Care Home Prior Approval Number:    Date Approved/Denied:   PASRR Number: pending  Discharge Plan: Other (Comment) (Group Home/AFL)    Current Diagnoses: There are no problems to display for this patient.   Orientation RESPIRATION BLADDER Height & Weight     Self, Place  Normal Incontinent, External catheter Weight: 90.7 kg Height:  6\' 1"  (185.4 cm)  BEHAVIORAL SYMPTOMS/MOOD NEUROLOGICAL BOWEL NUTRITION STATUS     (History of tramatic brain injury) Continent Diet (Regular)  AMBULATORY STATUS COMMUNICATION OF NEEDS Skin   Total Care Verbally Normal                       Personal Care Assistance Level of Assistance  Bathing, Dressing, Feeding Bathing Assistance: Maximum assistance Feeding assistance: Limited assistance Dressing Assistance: Maximum assistance     Functional Limitations Info  Sight, Hearing, Speech Sight Info: Impaired (Glasses) Hearing Info: Adequate Speech Info: Impaired    SPECIAL CARE FACTORS FREQUENCY                       Contractures Contractures Info: Not present    Additional Factors Info  Code Status Code Status Info: Full Allergies Info: NKDA           Current Medications (09/24/2021):  This is the current hospital active medication list Current Facility-Administered Medications  Medication Dose Route Frequency Provider Last Rate Last  Admin   acetaminophen (TYLENOL) tablet 650 mg  650 mg Oral TID 11/24/2021, MD   650 mg at 09/24/21 0913   ammonium lactate (LAC-HYDRIN) 12 % lotion 1 application.  1 application. Topical BID 11/24/21, MD   1 application. at 09/24/21 0934   busPIRone (BUSPAR) tablet 10 mg  10 mg Oral QHS 11/24/21, RPH   10 mg at 09/23/21 2207   busPIRone (BUSPAR) tablet 5 mg  5 mg Oral BID 2208, RPH   5 mg at 09/24/21 11/24/21   cholecalciferol (VITAMIN D3) tablet 1,000 Units  1,000 Units Oral Daily 9417, MD   1,000 Units at 09/24/21 0911   citalopram (CELEXA) tablet 20 mg  20 mg Oral BID 11/24/21, MD   20 mg at 09/24/21 0909   clindamycin (CLINDAGEL) 1 % gel 1 application.  1 application. Topical Daily PRN 11/24/21, MD       cloNIDine (CATAPRES) tablet 0.1 mg  0.1 mg Oral QHS Shaune Pollack, MD   0.1 mg at 09/23/21 2206   diazepam (VALIUM) tablet 2 mg  2 mg Oral QHS 2207, MD   2 mg at 09/23/21 2205   gabapentin (NEURONTIN) capsule 100 mg  100 mg Oral BID 2206, MD   100 mg at 09/24/21 0911   gabapentin (NEURONTIN) capsule 200 mg  200 mg Oral QHS 11/24/21, MD   200 mg at 09/24/21 0028   ibandronate (BONIVA) tablet 150 mg  150 mg Oral Q30 days Arnaldo NatalMalinda, Paul F, MD       ipratropium (ATROVENT) 0.06 % nasal spray 2 spray  2 spray Each Nare TID Shaune PollackIsaacs, Cameron, MD   2 spray at 09/24/21 0913   lamoTRIgine (LAMICTAL) tablet 100 mg  100 mg Oral q AM Dionne BucySiadecki, Sebastian, MD   100 mg at 09/24/21 [id number]   linaclotide (LINZESS) capsule 145 mcg  145 mcg Oral QAC breakfast Shaune PollackIsaacs, Cameron, MD   145 mcg at 09/24/21 0915   loratadine (CLARITIN) tablet 10 mg  10 mg Oral Daily Shaune PollackIsaacs, Cameron, MD   10 mg at 09/24/21 0910   modafinil (PROVIGIL) tablet 50 mg  50 mg Oral q morning Shaune PollackIsaacs, Cameron, MD   50 mg at 09/24/21 0910   pantoprazole (PROTONIX) EC tablet 40 mg  40 mg Oral Daily Shaune PollackIsaacs, Cameron, MD   40 mg at 09/24/21 0912   propranolol (INDERAL)  tablet 10 mg  10 mg Oral BID Shaune PollackIsaacs, Cameron, MD   10 mg at 09/24/21 0908   rivaroxaban (XARELTO) tablet 10 mg  10 mg Oral QPM Shaune PollackIsaacs, Cameron, MD   10 mg at 09/23/21 1919   tamsulosin (FLOMAX) capsule 0.4 mg  0.4 mg Oral Daily Shaune PollackIsaacs, Cameron, MD   0.4 mg at 09/24/21 0912   traZODone (DESYREL) tablet 50 mg  50 mg Oral Dorette GrateQHS Isaacs, Cameron, MD   50 mg at 09/23/21 2206   Current Outpatient Medications  Medication Sig Dispense Refill   acetaminophen (TYLENOL) 325 MG tablet Take 650 mg by mouth every 8 (eight) hours as needed for mild pain.     ammonium lactate (AMLACTIN) 12 % cream Apply 1 application. topically 2 (two) times daily.     bisacodyl (DULCOLAX) 10 MG suppository Place 10 mg rectally daily as needed for moderate constipation.     busPIRone (BUSPAR) 10 MG tablet Take 5-10 mg by mouth 3 (three) times daily.     chlorhexidine (PERIDEX) 0.12 % solution 15 mLs by Mouth Rinse route daily.     cholecalciferol (VITAMIN D3) 25 MCG (1000 UNIT) tablet Take 1,000 Units by mouth daily.     ciclopirox (LOPROX) 0.77 % cream Apply 1 application. topically 2 (two) times daily.     citalopram (CELEXA) 20 MG tablet Take 20 mg by mouth 2 (two) times daily.     clindamycin (CLEOCIN T) 1 % lotion Apply 1 application. topically as directed. Apply to areas of Folliculitis on days not showering with Benzoyl Peroxide     cloNIDine (CATAPRES) 0.1 MG tablet Take 0.1 mg by mouth at bedtime.     DENTA 5000 PLUS 1.1 % CREA dental cream Take 1 application. by mouth 2 (two) times daily.     diazepam (VALIUM) 2 MG tablet Take 2 mg by mouth at bedtime.     fexofenadine (ALLEGRA) 180 MG tablet Take 180 mg by mouth daily.     gabapentin (NEURONTIN) 100 MG capsule Take 100-200 mg by mouth 3 (three) times daily.     hydrOXYzine (ATARAX) 10 MG/5ML syrup Take 12.5 mLs by mouth 2 (two) times daily as needed for itching.     ipratropium (ATROVENT) 0.06 % nasal spray Place 2 sprays into both nostrils 3 (three) times daily.      ketoconazole (NIZORAL) 2 % cream Apply 1 application. topically daily. Apply daily to red, flaking skin of face and neck     ketoconazole (NIZORAL) 2 % shampoo Apply 1 application. topically every other day. On scalp  lamoTRIgine (LAMICTAL) 25 MG tablet PLEASE SEE ATTACHED FOR DETAILED DIRECTIONS     LINZESS 145 MCG CAPS capsule Take 145 mcg by mouth daily before breakfast.     mineral oil enema Place 1 enema rectally once as needed for severe constipation.     modafinil (PROVIGIL) 100 MG tablet Take 50 mg by mouth every morning.     MOTEGRITY 2 MG TABS Take 2 mg by mouth daily.     multivitamin-iron-minerals-folic acid (CENTRUM) chewable tablet Chew 1 tablet by mouth daily.     omeprazole (PRILOSEC OTC) 20 MG tablet Take 20 mg by mouth daily.     polyethylene glycol (MIRALAX / GLYCOLAX) 17 g packet Take 17 g by mouth 2 (two) times daily. 8 am and 8 pm     propranolol (INDERAL) 10 MG tablet Take 10 mg by mouth 2 (two) times daily. 8 am and 8 pm     senna (SENOKOT) 8.6 MG TABS tablet Take 2 tablets by mouth 2 (two) times daily. 8 am and 8 pm     tacrolimus (PROTOPIC) 0.1 % ointment Apply 1 application. topically 2 (two) times daily.     tamsulosin (FLOMAX) 0.4 MG CAPS capsule Take 0.4 mg by mouth daily.     traZODone (DESYREL) 50 MG tablet Take 50 mg by mouth at bedtime.     XARELTO 10 MG TABS tablet Take 10 mg by mouth daily.     ibandronate (BONIVA) 150 MG tablet Take 150 mg by mouth every 30 (thirty) days.       Discharge Medications: Please see discharge summary for a list of discharge medications.  Relevant Imaging Results:  Relevant Lab Results:   Additional Information SS# [id number]  Necedah Cellar, RN

## 2021-09-24 NOTE — ED Notes (Signed)
Pts brief checked - currently dry at this time - no other concerns at this time.

## 2021-09-24 NOTE — ED Notes (Signed)
TOC/possible tfr to [facility] Thursday or Friday

## 2021-09-24 NOTE — ED Notes (Signed)
Pt offered toileting and advised he did not have to go right now.

## 2021-09-24 NOTE — ED Notes (Signed)
Pt aide at bedside.

## 2021-09-24 NOTE — ED Notes (Addendum)
Documented on incorrect pt

## 2021-09-25 DIAGNOSIS — Z741 Need for assistance with personal care: Secondary | ICD-10-CM | POA: Diagnosis not present

## 2021-09-25 NOTE — ED Notes (Signed)
Pt declined needing anything at the moment. Vitals assessed.

## 2021-09-25 NOTE — ED Provider Notes (Signed)
Emergency Medicine Observation Re-evaluation Note  Derrick Jordan is a 36 y.o. male, seen on rounds today.   Physical Exam  BP 123/90   Pulse 85   Temp 98 F (36.7 C) (Oral)   Resp 14   Ht 6\' 1"  (1.854 m)   Wt 90.7 kg   SpO2 99%   BMI 26.39 kg/m  Physical Exam General: Patient resting comfortably in bed Lungs: Patient not in respiratory distress Psych: Patient not combative  ED Course / MDM  EKG:     Plan  Current plan is for awaiting placement by social work.  Derrick Jordan is not under involuntary commitment.  Note patient's baclofen pump is running out of baclofen.  Plan is for him to go to [facility] ER and have his baclofen pump refilled and have him return here.  This should happen on Friday but we are awaiting approval by the CMO apparently.   Sunday, MD 09/25/21 [id number]

## 2021-09-25 NOTE — ED Notes (Signed)
Pt cleaned up and new brief, gown, and chuck pads placed. Warm blanket given

## 2021-09-26 DIAGNOSIS — Z741 Need for assistance with personal care: Secondary | ICD-10-CM | POA: Diagnosis not present

## 2021-09-26 NOTE — ED Notes (Signed)
Condom catheter and bed linens replaced. Pt given water.

## 2021-09-26 NOTE — ED Notes (Signed)
[place] EDT and pt caregiver cleaned pt of BM, all linens replaced, new condom cath and urine bag placed

## 2021-09-26 NOTE — ED Provider Notes (Signed)
I received an update from the [facility] transfer center.  The plan now is that the patient, instead of going to the [facility] ED today, we will go to a clinic appointment on 6/12 at [facility], have the pump refilled, and then be transported back to the ED here.  Information for the appointment is as follows:  Monday June 12th, 9am [facility]1C in [facility] [address] [place], [place] [address] [phone]   Dionne Bucy, MD 09/26/21 1235

## 2021-09-26 NOTE — ED Notes (Signed)
Derrick Jordan set up  transport  for  Monday 6/12 at 7:30 am

## 2021-09-26 NOTE — TOC Progression Note (Signed)
Transition of Care [facility]) - Progression Note    Patient Details  Name: Derrick Jordan MRN: [id number] Date of Birth: 03-12-1986  Transition of Care [facility]) CM/SW Contact  Curtice Cellar, RN Phone Number: 09/26/2021, 6:51 PM  Clinical Narrative:    Confirmed unit secretary was able to arrange Zenaida Niece transport with safe transport and mother will be bringing wheelchair from home to ED to assist. Safe  transport  will  pick  pt  up on  Monday  at  7:30 am  per  Raynelle Fanning  supv  at  safe  transport. Mother will bring  wheelchair on  Sunday evening and her and sitter will meet patient at appointment.    Expected Discharge Plan:  (IFL- or home with adequate home services) Barriers to Discharge: Other (must enter comment) (placement)  Expected Discharge Plan and Services Expected Discharge Plan:  (IFL- or home with adequate home services)   Discharge Planning Services: CM Consult   Living arrangements for the past 2 months: Single Family Home                 DME Arranged: N/A DME Agency: NA                   Social Determinants of Health (SDOH) Interventions    Readmission Risk Interventions     No data to display

## 2021-09-26 NOTE — ED Notes (Signed)
Safe  transport  will  pick  pt  up  on  Monday  at  7:30 am and transport  pt  to  [place]  [place] [address] [phone] and  bring  PT back  to the  emergecny  room  when  done. Informed Mom and  she  will meet  PT there also  gave  Mom  my name  and number  if they need me to  call Safe  [facility]  for  any  reason

## 2021-09-26 NOTE — ED Provider Notes (Signed)
Today's Vitals   09/25/21 0731 09/25/21 1038 09/25/21 2215 09/26/21 0219  BP:      Pulse:      Resp:      Temp:      TempSrc:      SpO2:      Weight:      Height:      PainSc: 0-No pain 4  Asleep Asleep   Body mass index is 26.39 kg/m.   No acute events overnight.  Still awaiting social work disposition.   Demya Scruggs, [name], DO 09/26/21 [phone]

## 2021-09-26 NOTE — ED Notes (Signed)
Pt is resting watching tv. Please see MAR for medication administration

## 2021-09-26 NOTE — TOC Progression Note (Addendum)
Transition of Care [facility]) - Progression Note    Patient Details  Name: Carolyn Maniscalco MRN: [id number] Date of Birth: 06-14-1985  Transition of Care [facility]) CM/SW Contact  Pierson Cellar, RN Phone Number: 09/26/2021, 5:44 PM  Clinical Narrative:    Received VM from mother requesting clarification on who made appointment for Monday @ [facility]. Mother reports she did not make that appointment but has made appointment for June 13 @ [facility]. Mother concerned about who is transporting patient as she reports she is not able.   TOC LVMM for mother explaining that hospital has exhausted all means and has no available transportation to assist for appointment on Monday. Asking mother for additional resources for transportation and possibility of mother transporting to appointment.    Expected Discharge Plan:  (IFL- or home with adequate home services) Barriers to Discharge: Other (must enter comment) (placement)  Expected Discharge Plan and Services Expected Discharge Plan:  (IFL- or home with adequate home services)   Discharge Planning Services: CM Consult   Living arrangements for the past 2 months: Single Family Home                 DME Arranged: N/A DME Agency: NA                   Social Determinants of Health (SDOH) Interventions    Readmission Risk Interventions     No data to display

## 2021-09-27 DIAGNOSIS — Z741 Need for assistance with personal care: Secondary | ICD-10-CM | POA: Diagnosis not present

## 2021-09-27 NOTE — ED Notes (Signed)
One of pt's caregivers with him. Caregiver given extra urine drainage bag as requested.

## 2021-09-27 NOTE — ED Notes (Signed)
Pt changed position on bed; pt's caregiver applied moisture barrier/skin protecting cream on pt's peri area.

## 2021-09-27 NOTE — ED Notes (Signed)
Condom catheter replaced. Pt keeps pulling it off. Pads changed

## 2021-09-27 NOTE — ED Notes (Signed)
Caregiver and pt deny any needs currently.

## 2021-09-27 NOTE — ED Notes (Addendum)
Pt's mother set on having pt moved to wheelchair for at least an hour in preporation for transportation Monday. Pt's mother bringing in wheelchair momentarily. Will attempt transfer if enough staff members available to assist at one time as pt tall, weak/total care and approximately 200 lbs.

## 2021-09-27 NOTE — ED Provider Notes (Signed)
Emergency Medicine Observation Re-evaluation Note  Derrick Jordan is a 36 y.o. male, currently in the emergency department.  No acute events overnight or today.  Physical Exam  BP 129/81   Pulse 79   Temp 97.7 F (36.5 C) (Oral)   Resp 19   Ht 6\' 1"  (1.854 m)   Wt 90.7 kg   SpO2 98%   BMI 26.39 kg/m    ED Course / MDM  No recent labs for review Plan  Current plan is for patient to go to [facility] tomorrow to refill his baclofen pump, will then likely return back to [facility] emergency department.  Currently awaiting placement into an appropriate living facility.  Derrick Jordan is not under involuntary commitment.     Harvest Dark, MD 09/27/21 [phone]

## 2021-09-27 NOTE — ED Notes (Signed)
Caregiver and mother remain with pt. Pt denies needs currently.

## 2021-09-27 NOTE — ED Notes (Signed)
Pt has his glasses on and is sitting up watching tv. Caregiver remains at bedside.

## 2021-09-27 NOTE — ED Notes (Signed)
Messaged pharm about missing dose of meds scheduled for 1800. Will provide to pt once received from pharm.

## 2021-09-27 NOTE — ED Notes (Signed)
Pt alert and denies any needs though asking for coffee.

## 2021-09-27 NOTE — ED Notes (Addendum)
Pt's caregiver stating request of pt's mother to move him to recliner today using lift; charge RN states only hoyer lift is built into ceiling of EMS bay and cannot move pt's centrella bed into bay due to traffic and lack of space; pt continues to be repositioned regularly on centrella bed; caregiver requested juice and ice; given.

## 2021-09-27 NOTE — ED Notes (Signed)
Notified pharm still missing dose of xarelto. Will give scheduled meds once xarelto received from pharm.

## 2021-09-27 NOTE — ED Notes (Signed)
Patient giving crackers at this time and water

## 2021-09-27 NOTE — ED Notes (Signed)
Pt transferred from wheelchair to bed again using hoyer lift. Linens changed and pt repositioned. Pt's mother states she will be leaving bedside soon.

## 2021-09-27 NOTE — ED Notes (Signed)
Pt's mother updated on plan to attempt to work out timing to dodge EMS traffic and use hoyer lift in EMS bay to transfer pt from bed to wheelchair briefly as requested. Pt currently alert and calmly laying on bed. Pt's mother concerned he may have cold stating he seems fatigued.

## 2021-09-27 NOTE — ED Notes (Signed)
At this time, not able to complete safe transfer of pt from bed to wheelchair as requested by pt's mother; pt does not show signs of instability during regular repositioning in bed; charge RN aware of pt's mother's continued request.

## 2021-09-27 NOTE — ED Notes (Signed)
Pt is finished with his lunch tray; ate 100% of meal; urinated approximately 500cc of fluid. Caregiver remains with pt.

## 2021-09-27 NOTE — ED Notes (Signed)
Pt pulled off external cath again. Since pt continues to pull off external caths and will not have mother or caregiver at bedside to constantly talk him out of pulling it off will prompt/question pt often if he needs assistance with urinal.

## 2021-09-27 NOTE — ED Notes (Signed)
Pt transferred to personal wheelchair using hoyer lift in EMS bay. Pt sitting with mother watching tv.

## 2021-09-28 DIAGNOSIS — Z741 Need for assistance with personal care: Secondary | ICD-10-CM | POA: Diagnosis not present

## 2021-09-28 NOTE — ED Notes (Signed)
Report received from Maureen, RN 

## 2021-09-28 NOTE — ED Notes (Signed)
Pt sitting in bed, eating lunch.

## 2021-09-28 NOTE — ED Notes (Signed)
Patient brief and gown changed at this time 

## 2021-09-28 NOTE — ED Notes (Signed)
Rounding on patient, Pt is alert and watching television. Repositioned and placed a pillow between his knees. Observed that condom catheter is not on pt. He states that it rolled back off because his mother did not place it far enough down on his penis. Pt's brief and linens are dry.  No needs at this time

## 2021-09-28 NOTE — ED Notes (Signed)
Caregiver in room with pt. Pt alert, asking for coffee,. Per caregiver, pt's mother ordered coffee for pt. Pt calm, watching tv.

## 2021-09-28 NOTE — ED Notes (Addendum)
Pt with appointment at [facility] tomorrow am, [facility] to transport pt to appointment leaving at 0730 Mother wants pt's orthotics and pt's long gray socks placed on pt legs before being place in wheelchair to keep feet from falling off foot supports on wheelchair.

## 2021-09-28 NOTE — ED Notes (Signed)
Caregiver in room, helping pt with set up for meals, per caregiver, pt had BM this morning.

## 2021-09-28 NOTE — ED Notes (Signed)
Caregiver bathing pt, caregiver given more sheets and gowns for pt. Caregiver changed bedding.

## 2021-09-29 DIAGNOSIS — Z741 Need for assistance with personal care: Secondary | ICD-10-CM | POA: Diagnosis not present

## 2021-09-29 MED ORDER — HYDROXYZINE HCL 25 MG PO TABS
25.0000 mg | ORAL_TABLET | Freq: Three times a day (TID) | ORAL | Status: DC | PRN
  Administered 2021-09-30 (×2): 25 mg via ORAL
  Filled 2021-09-29 (×2): qty 1

## 2021-09-29 NOTE — ED Notes (Signed)
Changed pt's linens, chuck pads, and gown and performed peri-care. Brief placed on pt. Pt refuses to wear condom cath

## 2021-09-29 NOTE — ED Notes (Signed)
Pt placed in wheelchair by this RN, Consulting civil engineer, nightshift RN and MD. Orthotics placed on pt prior to transport. Pt brief clean and dry at this time. Secretary given med necessity.

## 2021-09-29 NOTE — ED Notes (Signed)
This RN spoke with mother. Mother state pt normally receives a dose of Hydroxyzine 25mg  prior to procedure for anxiety and agitation. MD made aware to potentially place order for tomorrow.

## 2021-09-29 NOTE — ED Notes (Signed)
Pt's mother requesting to speak to charge RN. Mother stating to this RN she was not allowed out of the waiting room and pt was still refusing to get baclofen pump refilled. Pt left medical facility without getting procedure.

## 2021-09-29 NOTE — ED Notes (Addendum)
This RN received call from Nurse Manager at clinic where pt is getting baclofen pump. Nurse Manager stating pt is refusing to have baclofen pump refilled. This RN questioning mothers presence and manager stating there is a note stating pt's mother is in question by CPS. Lattie Haw, Network engineer in contact with case manager, Andreas Newport, and reassures pt's mother is still legal guardian. Nurse Manager given Susan's number for contact [phone].

## 2021-09-29 NOTE — ED Notes (Signed)
Pt is resting quietly with his eyes closed, RR are even and unlabored. Pt remains dry. Siderails up, call bell within reach. No acute distress noted

## 2021-09-29 NOTE — ED Notes (Signed)
Claris Pong  Transport supervisor  set up  transport  for  Tuesday  09/29/21  at  10:am To  transport  pt  to  [facility]  for  rehabilitation  care  at  [address]  [place]  [address]  [phone]   informed  mom

## 2021-09-29 NOTE — ED Notes (Signed)
Pt placed in safe transport van by this RN, Consulting civil engineer, [employer] at this time. EVS called to clean pt's room while gone. Mother contacted by Diplomatic Services operational officer of transportation status.

## 2021-09-29 NOTE — ED Notes (Signed)
Pt caregiver at bedside 

## 2021-09-29 NOTE — ED Notes (Addendum)
Secretary received an update from the [facility]. The plan is that the patient will go to a clinic appointment on 6/13 at [facility], have pump refilled, and then be transported back to the ED here via safe transport. Safe transport will arrive for pt at 10am.    Information for the appointment is as follows:  Tuesday September 30, 2021 at 11:20am [facility]  [address] [place], [place] [address] [phone]

## 2021-09-29 NOTE — TOC Progression Note (Signed)
Transition of Care [facility]) - Progression Note    Patient Details  Name: Derrick Jordan MRN: [id number] Date of Birth: [birth year]  Transition of Care [facility]) CM/SW Contact  Shelbie Hutching, RN Phone Number: 09/29/2021, 3:46 PM  Clinical Narrative:    Update from [place] Case Manager :  From Waynetta Pean "We are still working on finding a placement for him. The homes that I have reached out to are [employer] (referral information sent), [facility] (sent referral information), [facility] (sent referral information), [place] (sent referral information), [facility] (stated that they did not have any openings), [facility] (did not answer and could not leave a message due to mailbox being full), and [employer] [facility] [employer] (only work with kids)."    Expected Discharge Plan:  (IFL- or home with adequate home services) Barriers to Discharge: Other (must enter comment) (placement)  Expected Discharge Plan and Services Expected Discharge Plan:  (IFL- or home with adequate home services)   Discharge Planning Services: CM Consult   Living arrangements for the past 2 months: Single Family Home                 DME Arranged: N/A DME Agency: NA                   Social Determinants of Health (SDOH) Interventions    Readmission Risk Interventions     No data to display

## 2021-09-29 NOTE — ED Notes (Signed)
Pt back in room. Pt placed in clean and dry brief. Pt adjusted in bed and provided meal tray.

## 2021-09-30 DIAGNOSIS — Z741 Need for assistance with personal care: Secondary | ICD-10-CM | POA: Diagnosis not present

## 2021-09-30 NOTE — ED Notes (Signed)
Pt back from procedure

## 2021-09-30 NOTE — ED Notes (Signed)
Rounding-Patient asleep, respirations noted, bed in lowest position with side rail up, call bell within reach 

## 2021-09-30 NOTE — ED Notes (Signed)
This RN cleaned pt. Pt was saturated in urine. Clean brief and gown placed prior to transport to procedure.

## 2021-09-30 NOTE — ED Notes (Signed)
RN to bedside to introduce self to pt. Pt sleeping.  

## 2021-09-30 NOTE — ED Notes (Signed)
Pt left for procedure at [facility] and will return.

## 2021-09-30 NOTE — ED Provider Notes (Signed)
     09/30/2021    9:07 PM 09/30/2021    7:11 AM 09/30/2021    3:59 AM  Vitals with BMI  Systolic 116 118 765  Diastolic 64 76 67  Pulse 79 80 77     Patient went to [facility] to have baclofen pump exchange and returned without issue.  No other acute events last 24 hours.   Georga Hacking, MD 09/30/21 2312

## 2021-09-30 NOTE — ED Notes (Signed)
Caregiver in room while administering medication. Caregiver questioned as to when she would get condom cath placed on pt. Advised caregiver that per prior reports received, pt would be placed in brief due to his inability to keep condom cath on and the need for it to be frequently replaced. Carwegiver immediately phoned pt mother while this nurse in room to report conversation. Caregiver and mother both questioning this nurse stating " why dont yall have CNAs or people to care for him. Earlie Server aint doing what needs to be done for him." Advised that this conversation has been had previously by multiple staff members with mother and that pt is being cared for to the best ability of the ED and staff, however this is an ER and not a long term care facility so emergency situations take priority and pt will not have 24/7 sitter or caregiver by staff. Caregiver continues conversation with mother making derogatory comments about staff and care of pt.

## 2021-09-30 NOTE — ED Notes (Addendum)
Rounding-Patient asleep, respirations noted, bed in lowest position with side rail up, call bell within reach 

## 2021-10-01 DIAGNOSIS — Z741 Need for assistance with personal care: Secondary | ICD-10-CM | POA: Diagnosis not present

## 2021-10-01 NOTE — ED Notes (Signed)
Pt's private caregiver at bedside assisted in feeding pt. Found pt with a condom cath; caregiver placed personal condom catheter, did not tell any one prior to placement.

## 2021-10-01 NOTE — ED Notes (Signed)
Assisted pt with breakfast, pt brief dry. Pillows repositioned for pt's comfort.

## 2021-10-01 NOTE — ED Notes (Signed)
Pt changed into dry brief, linens changed, and pt repositioned in bed. Pt conversational and voices no other needs.

## 2021-10-01 NOTE — ED Notes (Signed)
Pt's private caregiver at bedside. This RN assisted caregiver clean and change pt. More towels provided per caregiver's request.

## 2021-10-01 NOTE — TOC Progression Note (Signed)
Transition of Care [facility]) - Progression Note    Patient Details  Name: Derrick Jordan MRN: [id number] Date of Birth: [birth year]  Transition of Care [facility]) CM/SW Contact  Maquon Cellar, RN Phone Number: 10/01/2021, 11:03 AM  Clinical Narrative:    Outreach to Susa Simmonds requesting update on potential facility placement.   Spoke to O'Brien @ [facility] requesting update on staffing for home care. Confirmed [facility] had spoken with mother and updated on process. TOC continues to work on temp respite placement.    Expected Discharge Plan:  (IFL- or home with adequate home services) Barriers to Discharge: Other (must enter comment) (placement)  Expected Discharge Plan and Services Expected Discharge Plan:  (IFL- or home with adequate home services)   Discharge Planning Services: CM Consult   Living arrangements for the past 2 months: Single Family Home                 DME Arranged: N/A DME Agency: NA                   Social Determinants of Health (SDOH) Interventions    Readmission Risk Interventions     No data to display

## 2021-10-01 NOTE — TOC Progression Note (Addendum)
Transition of Care [facility]) - Progression Note    Patient Details  Name: Derrick Jordan MRN: [id number] Date of Birth: 1986-04-12  Transition of [facility]) CM/SW Contact  Dayton Cellar, RN Phone Number: 10/01/2021, 2:34 PM  Clinical Narrative:    Spoke to wayne @ [facility], IDD program manager who reports they are no closer to staffing. Have nothing more than 5 names/numbers provided by patients mother but have been unable to get completed application on them. Reports he is also working with Laurena Bering on getting increased payment rate in order to expedite discharge.Discussed possibility that patient may end up discharging to facility instead of home. TOC will outreach to guardian and update.  LVMM for guardian, mother with updates.    Expected Discharge Plan:  (IFL- or home with adequate home services) Barriers to Discharge: Other (must enter comment) (placement)  Expected Discharge Plan and Services Expected Discharge Plan:  (IFL- or home with adequate home services)   Discharge Planning Services: CM Consult   Living arrangements for the past 2 months: Single Family Home                 DME Arranged: N/A DME Agency: NA                   Social Determinants of Health (SDOH) Interventions    Readmission Risk Interventions     No data to display

## 2021-10-01 NOTE — ED Notes (Signed)
Patient returned to the ED still Pending Placement

## 2021-10-02 DIAGNOSIS — Z741 Need for assistance with personal care: Secondary | ICD-10-CM | POA: Diagnosis not present

## 2021-10-02 NOTE — ED Provider Notes (Signed)
Today's Vitals   10/01/21 2108 10/02/21 0300 10/02/21 0307 10/02/21 0607  BP: 133/83   112/66  Pulse: 75   80  Resp: 15   20  Temp: 98.3 F (36.8 C)     TempSrc:      SpO2: 100%   98%  Weight:      Height:      PainSc: 0-No pain Asleep Asleep    Body mass index is 26.39 kg/m.   No acute events overnight.  Awaiting social work disposition.   Jian Hodgman, [name], DO 10/02/21 [phone]

## 2021-10-02 NOTE — ED Notes (Signed)
Pt removed his condom cath - brief is dry at this time. Pt repositioned in bed.

## 2021-10-02 NOTE — ED Notes (Signed)
VOLUNTARY continues to await TOC placement/dispo

## 2021-10-03 DIAGNOSIS — Z741 Need for assistance with personal care: Secondary | ICD-10-CM | POA: Diagnosis not present

## 2021-10-03 NOTE — TOC Progression Note (Signed)
Transition of Care [facility]) - Progression Note    Patient Details  Name: Davan Nawabi MRN: [id number] Date of Birth: 06/19/1985  Transition of Care [facility]) CM/SW Contact  Allayne Butcher, RN Phone Number: 10/03/2021, 1:07 PM  Clinical Narrative:    Reached Out to [facility] case manager for update- Susa Simmonds is out of the office but her supervisor Lucienne Minks responded via email.   Brianne Bates response:   Inez Pilgrim and our care manager extender staff, Lewie Chamber, have been outreaching residential placements for [place]. My understanding is mom initially requested [place] not to do that and then changed her mind, so this just started about two weeks ago with no leads at this time. Alayna located an additional agency to staff the home based hours and both agencies ([facility]) are in the process of hiring on staff. Mom has requested specific staff to be hired and as of yesterday, only one was near completion with the hiring process. Inez Pilgrim has also outreached other HCBS provider for both habilitative, respite and nursing respite services.  We are exploring all avenues possible to get him placed or receive in home services that are needed. "    Expected Discharge Plan:  (IFL- or home with adequate home services) Barriers to Discharge: Other (must enter comment) (placement)  Expected Discharge Plan and Services Expected Discharge Plan:  (IFL- or home with adequate home services)   Discharge Planning Services: CM Consult   Living arrangements for the past 2 months: Single Family Home                 DME Arranged: N/A DME Agency: NA                   Social Determinants of Health (SDOH) Interventions    Readmission Risk Interventions     No data to display

## 2021-10-03 NOTE — ED Notes (Signed)
Patient is laying in bed and declined being cleaned at this time. Patient is watching TV.

## 2021-10-03 NOTE — ED Notes (Signed)
Care giver in room. Informed them where and how to find me

## 2021-10-03 NOTE — ED Notes (Signed)
Patient is awake, watching TV. Patient was repositioned in bed.

## 2021-10-03 NOTE — ED Notes (Signed)
Patient is asleep.  

## 2021-10-03 NOTE — ED Notes (Signed)
VOL/TOC continues to work on Scientist, research (physical sciences)

## 2021-10-03 NOTE — ED Notes (Signed)
Assumed care of patient.  No needs at this time.

## 2021-10-03 NOTE — ED Notes (Signed)
Patient was incontinent to urine. Patients linens were changed and patient was cleaned. Patient is now resting in bed.

## 2021-10-03 NOTE — ED Notes (Signed)
Breakfast given.  

## 2021-10-03 NOTE — ED Notes (Signed)
Patient is up watching TV and drinking coffee. Patient legs were repositioned and supported with pillows.

## 2021-10-03 NOTE — ED Notes (Signed)
TOC placement 

## 2021-10-04 DIAGNOSIS — Z741 Need for assistance with personal care: Secondary | ICD-10-CM | POA: Diagnosis not present

## 2021-10-04 MED ORDER — POLYETHYLENE GLYCOL 3350 17 G PO PACK: 17.0000 g | PACK | Freq: Every day | ORAL | Status: DC

## 2021-10-04 MED ORDER — POLYETHYLENE GLYCOL 3350 17 G PO PACK
17.0000 g | PACK | Freq: Every day | ORAL | Status: DC
  Administered 2021-10-04 – 2021-12-15 (×60): 17 g via ORAL
  Filled 2021-10-04 (×68): qty 1

## 2021-10-04 NOTE — ED Notes (Addendum)
Pt brief and chucks changed, pt assisted with gingerale to drink. Pt call light within reach and patient sitting in bed no needs verbalized at this time

## 2021-10-04 NOTE — ED Provider Notes (Signed)
Today's Vitals   10/03/21 1137 10/03/21 1702 10/03/21 2309 10/03/21 2310  BP:   99/70 99/70  Pulse:    91  Resp:    17  Temp:    98.4 F (36.9 C)  TempSrc:    Oral  SpO2:    97%  Weight:      Height:      PainSc: 0-No pain 0-No pain     Body mass index is 26.39 kg/m.   No acute events overnight.  Awaiting social work disposition.   Dareen Gutzwiller, [name], DO 10/04/21 0301

## 2021-10-04 NOTE — ED Notes (Signed)
Pt napping in bed, asking for me to return for medication admin at 6pm.

## 2021-10-04 NOTE — ED Notes (Signed)
Patient incontinent of urine. Patient was cleaned and changed. Patient refuses condom catheter at this time. Patient was repositioned in bed.

## 2021-10-04 NOTE — ED Notes (Addendum)
Pt mother asking for assistance with changing bedding on patient as pt removed condom cath. Pt bedding changed. Mother stating that pt hasnt had BM in 3 days. Pt abdomen feels distended and firm at this time. Pt wincing with palpation  Pt mother stating caregiver gave enema today but patient did not have. This RN informed mother that the patient and his meds, including enema, needs to be provided by [place], for patient safety

## 2021-10-04 NOTE — ED Notes (Signed)
Pt brief and chux changed. Pt resting in bed

## 2021-10-04 NOTE — ED Notes (Signed)
Patient is awake and watching TV.

## 2021-10-04 NOTE — ED Notes (Addendum)
Pt and caregiver given fresh sheets, chucks, briefs, and bath wipes at this time. Room mopped with EVS assistance

## 2021-10-05 DIAGNOSIS — Z741 Need for assistance with personal care: Secondary | ICD-10-CM | POA: Diagnosis not present

## 2021-10-05 NOTE — ED Notes (Signed)
Pt care giver at bedside attending to pt with ADLs, she will be here until 1700.

## 2021-10-05 NOTE — ED Notes (Signed)
Patient brief and sheets and gown changed at this time

## 2021-10-05 NOTE — ED Notes (Signed)
Pt mother at bedside

## 2021-10-05 NOTE — ED Notes (Signed)
Home aid remains at bedside.

## 2021-10-05 NOTE — ED Notes (Signed)
Pt cleaned of incontinence, linens changed, placed in dry gown and repositioned in bed. Provided apple juice per request. No other needs voiced

## 2021-10-06 DIAGNOSIS — Z741 Need for assistance with personal care: Secondary | ICD-10-CM | POA: Diagnosis not present

## 2021-10-06 NOTE — ED Notes (Signed)
Pt incontinent of urine. Incontinent pads and gown changed.

## 2021-10-06 NOTE — ED Provider Notes (Signed)
-----------------------------------------   5:04 AM on 10/06/2021 -----------------------------------------   Blood pressure 133/87, pulse 76, temperature 98 F (36.7 C), resp. rate 17, height 6\' 1"  (1.854 m), weight 90.7 kg, SpO2 96 %.  The patient is calm and cooperative at this time.  There have been no acute events since the last update.  Awaiting disposition plan from Social Work team.   , MD 10/06/21 [phone]

## 2021-10-06 NOTE — ED Notes (Signed)
Pt repositioned and caregiver is at the bedside changing pt's linens. Vitals are stable. No needs at time.

## 2021-10-06 NOTE — ED Notes (Signed)
Report received from Whitney, RN.

## 2021-10-07 DIAGNOSIS — Z741 Need for assistance with personal care: Secondary | ICD-10-CM | POA: Diagnosis not present

## 2021-10-07 NOTE — ED Notes (Signed)
Pt is resting quietly, chest rise and fall observed. No acute distress noted.

## 2021-10-07 NOTE — ED Provider Notes (Signed)
-----------------------------------------   6:42 AM on 10/07/2021 -----------------------------------------   Blood pressure (!) 120/97, pulse 84, temperature 97.7 F (36.5 C), temperature source Oral, resp. rate 17, height 6\' 1"  (1.854 m), weight 90.7 kg, SpO2 99 %.  The patient is calm and cooperative at this time.  There have been no acute events since the last update.  Awaiting disposition plan from Social Work team.   , MD 10/07/21 [phone]

## 2021-10-07 NOTE — ED Provider Notes (Signed)
Emergency Medicine Observation Re-evaluation Note  Kevonta Phariss is a 36 y.o. male, seen on rounds today.  Pt initially presented to the ED for complaints of Urinary Retention Currently, the patient is resting calmly.  Physical Exam  BP 125/80 (BP Location: Right Arm)   Pulse 83   Temp 98 F (36.7 C) (Oral)   Resp 18   Ht 6\' 1"  (1.854 m)   Wt 90.7 kg   SpO2 98%   BMI 26.39 kg/m  Physical Exam General: no a cute distress  Psych: calm  ED Course / MDM  EKG:   I have reviewed the labs performed to date as well as medications administered while in observation.  Recent changes in the last 24 hours include none.  Plan  Current plan is for social work dispo.  Lynk Marti is not under involuntary commitment.     [name], MD 10/07/21 2241

## 2021-10-07 NOTE — ED Notes (Signed)
Pt continues to sleep, respirations are even and unlabored. No needs at this time

## 2021-10-07 NOTE — TOC Progression Note (Signed)
Transition of Care [facility]) - Progression Note    Patient Details  Name: Derrick Jordan MRN: [id number] Date of Birth: Apr 18, 1986  Transition of Care [facility]) CM/SW Contact  Allayne Butcher, RN Phone Number: 10/07/2021, 3:46 PM  Clinical Narrative:    Reached out to the [facility] IDD Care Management Manager, Lucienne Minks [phone].  They are currently able to provide some staff and can let them come to the hospital.  They do not have full staffing yet which the mother reports she must have for him to return home. She has also changed her mind on patient going to a residential setting.   TOC will cont to follow up with [facility] on home staffing.    Expected Discharge Plan:  (IFL- or home with adequate home services) Barriers to Discharge: Other (must enter comment) (placement)  Expected Discharge Plan and Services Expected Discharge Plan:  (IFL- or home with adequate home services)   Discharge Planning Services: CM Consult   Living arrangements for the past 2 months: Single Family Home                 DME Arranged: N/A DME Agency: NA                   Social Determinants of Health (SDOH) Interventions    Readmission Risk Interventions     No data to display

## 2021-10-07 NOTE — ED Notes (Addendum)
Pt pulled off condom cath.  Pads and brief noted to be wet.  New brief and pads place on pt.  Pt refused new condom cath placement at this time.  Pt provided with new blankets and denies any further needs.

## 2021-10-07 NOTE — ED Notes (Signed)
The pt was cleaned with new brief placed on the pt. The pt's clothing and bedding was replaced with new.

## 2021-10-07 NOTE — ED Notes (Signed)
TOC Placement 

## 2021-10-08 DIAGNOSIS — Z741 Need for assistance with personal care: Secondary | ICD-10-CM | POA: Diagnosis not present

## 2021-10-08 NOTE — ED Notes (Signed)
Pt brief checked at this time. Purewick in place and draining properly

## 2021-10-08 NOTE — ED Notes (Signed)
Pt is asleep at this time. Equal rise and fall of the chest.  

## 2021-10-09 DIAGNOSIS — Z741 Need for assistance with personal care: Secondary | ICD-10-CM | POA: Diagnosis not present

## 2021-10-09 NOTE — TOC Progression Note (Signed)
Transition of Care [facility]) - Progression Note    Patient Details  Name: Goble Fudala MRN: [id number] Date of Birth: February 13, 1986  Transition of Care [facility]) CM/SW Contact  Allayne Butcher, RN Phone Number: 10/09/2021, 3:51 PM  Clinical Narrative:    Teams meeting with Levonne Lapping, [facility], and patient's mother.  After discussing in depth the problems [facility] are having with staffing it was decided that going home was not the safest discharge plan as the patient's mother is not able to care for him without the assistance of full time staffing.  The workers that mother has been talking too have not competed their paperwork, or orientations, or other requirements to work for [employer] or [employer].  Patient's mother would like for [facility] to search for residential placement using the innovations waiver.  The waiver will expire on 6/20 if a discharge plan has not been established.   RNCM discussed SNF with mother, she prefers residential placement with the waiver so Laurena Bering will be working on finding placement this upcoming week.     Expected Discharge Plan:  (IFL- or home with adequate home services) Barriers to Discharge: Other (must enter comment) (placement)  Expected Discharge Plan and Services Expected Discharge Plan:  (IFL- or home with adequate home services)   Discharge Planning Services: CM Consult   Living arrangements for the past 2 months: Single Family Home                 DME Arranged: N/A DME Agency: NA                   Social Determinants of Health (SDOH) Interventions    Readmission Risk Interventions     No data to display

## 2021-10-09 NOTE — ED Notes (Signed)
Pt given breakfast tray and assisted with eating, pt asked for TV to be turned on and lights cut off. Will continue to monitor

## 2021-10-09 NOTE — ED Notes (Signed)
Pt took off purewick. Pt cleaned up and changed into new brief and gown at this time.

## 2021-10-10 DIAGNOSIS — Z741 Need for assistance with personal care: Secondary | ICD-10-CM | POA: Diagnosis not present

## 2021-10-10 MED ORDER — IBANDRONATE SODIUM 150 MG PO TABS
150.0000 mg | ORAL_TABLET | ORAL | Status: AC
  Administered 2021-10-10: 150 mg via ORAL
  Filled 2021-10-10: qty 1

## 2021-10-10 MED ORDER — IBANDRONATE SODIUM 150 MG PO TABS
150.0000 mg | ORAL_TABLET | ORAL | Status: DC
  Filled 2021-10-10: qty 1

## 2021-10-11 DIAGNOSIS — Z741 Need for assistance with personal care: Secondary | ICD-10-CM | POA: Diagnosis not present

## 2021-10-11 NOTE — ED Notes (Signed)
Pt is noted to have urinated, brief he is wearing has been adjusted and not on properly. Pt cleaned and new brief, gown, and chuck provided. Pt remains in position of comfort

## 2021-10-11 NOTE — ED Notes (Signed)
Patient incontinent of urine. Bed pads soaked. Patient cleaned and changed. Brief placed on patient. Patient requested no gown and just a sheet overtop of him. Call bell in place. Lights off and door shut per patient request

## 2021-10-12 DIAGNOSIS — Z741 Need for assistance with personal care: Secondary | ICD-10-CM | POA: Diagnosis not present

## 2021-10-12 MED ORDER — IBANDRONATE SODIUM 150 MG PO TABS
150.0000 mg | ORAL_TABLET | ORAL | Status: DC
  Filled 2021-10-12 (×4): qty 1

## 2021-10-12 NOTE — ED Notes (Signed)
Pts brief changed, gown, and linen changed. No other concerns at this time.

## 2021-10-12 NOTE — ED Notes (Signed)
Pt's private staff in room bathing and shaving pt. Pt alert, no needs at this time.

## 2021-10-13 DIAGNOSIS — Z741 Need for assistance with personal care: Secondary | ICD-10-CM | POA: Diagnosis not present

## 2021-10-14 DIAGNOSIS — Z741 Need for assistance with personal care: Secondary | ICD-10-CM | POA: Diagnosis not present

## 2021-10-14 NOTE — ED Notes (Signed)
Pt eating breakfast with tray and beverages in reach. Pt denies any further needs.

## 2021-10-14 NOTE — ED Notes (Signed)
Resumed care from dee rn.  Pt alert.  Sitter with pt.  No acute distress.

## 2021-10-14 NOTE — ED Notes (Signed)
Pt eating dinner meal.  Pt alert, watching tv.  Meds given.

## 2021-10-15 DIAGNOSIS — Z741 Need for assistance with personal care: Secondary | ICD-10-CM | POA: Diagnosis not present

## 2021-10-15 NOTE — ED Notes (Signed)
Pt changed of incontinent brief. Condom cath found removed and lying in bed. Pt states he does not want cath put on. Repositioned to position of comfort. New linen placed on bed and gown changed. Pt voices no other needs at this time.

## 2021-10-15 NOTE — ED Notes (Signed)
Pharmacy tech in room to speak with mother

## 2021-10-15 NOTE — ED Notes (Signed)
This RN spoke with Mother of Pt about medications they have been giving from outside of the hospital. Mother will meet with pharmacy tech once she gets here to figure out the issues with med admin.

## 2021-10-15 NOTE — ED Notes (Signed)
This RN noticed empty package for Biscodyl lying on counter. Caregiver confirmed she is giving patient meds from outside the facility. Will make admin staff aware as this is against policy.

## 2021-10-15 NOTE — ED Notes (Signed)
This Rn spoke with caregiver. Caregiver advised she is giving PO meds from home as ordered by mother. This RN informed caregiver we could not allow them to continue to do this. If there was meds that need to be given, they need to be placed on the [facility] and given from the pharmacy here. She advised mother would be here this afternoon and we could clarify with her. This RN removed all bottles of medications from the room.

## 2021-10-15 NOTE — ED Provider Notes (Signed)
-----------------------------------------   7:42 AM on 10/15/2021 -----------------------------------------   Blood pressure 112/64, pulse 79, temperature 97.6 F (36.4 C), temperature source Oral, resp. rate 18, height 6\' 1"  (1.854 m), weight 90.7 kg, SpO2 98 %.  The patient is calm and cooperative at this time.  There have been no acute events since the last update.  Awaiting disposition plan from Social Work team.   , MD 10/15/21 [phone]

## 2021-10-15 NOTE — ED Notes (Signed)
RN to bedside. Pt sleeping at this time.

## 2021-10-15 NOTE — ED Notes (Signed)
Pt cleaned and dried. New brief placed.

## 2021-10-15 NOTE — TOC Progression Note (Addendum)
Transition of Care [facility]) - Progression Note    Patient Details  Name: Derrick Jordan MRN: [id number] Date of Birth: [birth year]  Transition [facility] Care [facility]) CM/SW Contact  Allayne Butcher, RN Phone Number: 10/15/2021, 4:59 PM  Clinical Narrative:    RNCM reached out to [facility], they report that the mother has still not signed consent forms for them to do residential search.  RNCM made a new APS report to [facility] APS.   No bed offers for SNF at this time.    Expected Discharge Plan:  (IFL- or home with adequate home services) Barriers to Discharge: Other (must enter comment) (placement)  Expected Discharge Plan and Services Expected Discharge Plan:  (IFL- or home with adequate home services)   Discharge Planning Services: CM Consult   Living arrangements for the past 2 months: Single Family Home                 DME Arranged: N/A DME Agency: NA                   Social Determinants of Health (SDOH) Interventions    Readmission Risk Interventions     No data to display

## 2021-10-16 DIAGNOSIS — Z741 Need for assistance with personal care: Secondary | ICD-10-CM | POA: Diagnosis not present

## 2021-10-16 LAB — CBC WITH DIFFERENTIAL/PLATELET
Abs Immature Granulocytes: 0.04 10*3/uL (ref 0.00–0.07)
Basophils Absolute: 0.1 10*3/uL (ref 0.0–0.1)
Basophils Relative: 1 %
Eosinophils Absolute: 0.2 10*3/uL (ref 0.0–0.5)
Eosinophils Relative: 2 %
HCT: 44.7 % (ref 39.0–52.0)
Hemoglobin: 14.2 g/dL (ref 13.0–17.0)
Immature Granulocytes: 1 %
Lymphocytes Relative: 29 %
Lymphs Abs: 2 10*3/uL (ref 0.7–4.0)
MCH: 27.8 pg (ref 26.0–34.0)
MCHC: 31.8 g/dL (ref 30.0–36.0)
MCV: 87.6 fL (ref 80.0–100.0)
Monocytes Absolute: 0.6 10*3/uL (ref 0.1–1.0)
Monocytes Relative: 9 %
Neutro Abs: 4 10*3/uL (ref 1.7–7.7)
Neutrophils Relative %: 58 %
Platelets: 303 10*3/uL (ref 150–400)
RBC: 5.1 MIL/uL (ref 4.22–5.81)
RDW: 12 % (ref 11.5–15.5)
WBC: 6.8 10*3/uL (ref 4.0–10.5)
nRBC: 0 % (ref 0.0–0.2)

## 2021-10-16 LAB — COMPREHENSIVE METABOLIC PANEL
ALT: 53 U/L — ABNORMAL HIGH (ref 0–44)
AST: 31 U/L (ref 15–41)
Albumin: 4.4 g/dL (ref 3.5–5.0)
Alkaline Phosphatase: 71 U/L (ref 38–126)
Anion gap: 10 (ref 5–15)
BUN: 11 mg/dL (ref 6–20)
CO2: 29 mmol/L (ref 22–32)
Calcium: 9.2 mg/dL (ref 8.9–10.3)
Chloride: 100 mmol/L (ref 98–111)
Creatinine, Ser: 0.64 mg/dL (ref 0.61–1.24)
GFR, Estimated: >60 mL/min (ref >60)
Glucose, Bld: 100 mg/dL — ABNORMAL HIGH (ref 70–99)
Potassium: 3.7 mmol/L (ref 3.5–5.1)
Sodium: 139 mmol/L (ref 135–145)
Total Bilirubin: 0.6 mg/dL (ref 0.3–1.2)
Total Protein: 7.4 g/dL (ref 6.5–8.1)

## 2021-10-16 LAB — URINALYSIS, ROUTINE W REFLEX MICROSCOPIC
Bilirubin Urine: NEGATIVE
Glucose, UA: NEGATIVE mg/dL
Hgb urine dipstick: NEGATIVE
Ketones, ur: NEGATIVE mg/dL
Leukocytes,Ua: NEGATIVE
Nitrite: NEGATIVE
Protein, ur: NEGATIVE mg/dL
Specific Gravity, Urine: 1.002 — ABNORMAL LOW (ref 1.005–1.030)
pH: 8 (ref 5.0–8.0)

## 2021-10-16 NOTE — ED Provider Notes (Signed)
-----------------------------------------   5:14 AM on 10/16/2021 -----------------------------------------   Blood pressure 123/82, pulse 91, temperature 97.6 F (36.4 C), temperature source Oral, resp. rate 15, height 6\' 1"  (1.854 m), weight 90.7 kg, SpO2 98 %.  The patient is calm and cooperative at this time.  There have been no acute events since the last update.  No bed offers for SNF at this time.  APS report made as mother seems to be a roadblock to allow social work to do a residential search.  Awaiting disposition plan from Social Work team.   , MD 10/16/21 [phone]

## 2021-10-16 NOTE — ED Notes (Signed)
Spoke with Ulyess Blossom, RN at [employer] [employer]. States patient may receive placement with them depending on staffing requirements for patient's level of care. Per Lupita Leash, would present case to management and call back with update.

## 2021-10-16 NOTE — ED Notes (Addendum)
Ulyess Blossom with [facility] called at this time regarding potential placement. No answer, left message.   [phone]

## 2021-10-16 NOTE — ED Notes (Signed)
Pt slightly more tired than usual. Pt still took medicine as normal and is up eating breakfast.

## 2021-10-16 NOTE — ED Notes (Signed)
TOC  PLACEMENT 

## 2021-10-17 DIAGNOSIS — Z741 Need for assistance with personal care: Secondary | ICD-10-CM | POA: Diagnosis not present

## 2021-10-17 NOTE — ED Notes (Signed)
Report received from Bill, RN 

## 2021-10-17 NOTE — ED Notes (Signed)
Pt sleeping. Awakened to voice. Able to take po meds. No complaints at this time. Dry, call bell within reach, be in low position.

## 2021-10-17 NOTE — ED Provider Notes (Signed)
Today's Vitals   10/16/21 1106 10/16/21 1831 10/16/21 2210 10/16/21 2318  BP:      Pulse:      Resp:      Temp:      TempSrc:      SpO2:      Weight:      Height:      PainSc: Asleep 0-No pain Asleep 0-No pain   Body mass index is 26.39 kg/m.   No acute events overnight.  Awaiting social work disposition.  Lab work, urine done yesterday shows no acute abnormality.   Saajan Willmon, [name], DO 10/17/21 [phone]

## 2021-10-17 NOTE — ED Notes (Signed)
Pt changed, sheet changed, pt cleaned up. Pt dry with dry clean linen.

## 2021-10-18 DIAGNOSIS — Z741 Need for assistance with personal care: Secondary | ICD-10-CM | POA: Diagnosis not present

## 2021-10-18 DIAGNOSIS — R339 Retention of urine, unspecified: Secondary | ICD-10-CM | POA: Diagnosis not present

## 2021-10-18 DIAGNOSIS — Z20822 Contact with and (suspected) exposure to covid-19: Secondary | ICD-10-CM | POA: Diagnosis not present

## 2021-10-18 NOTE — ED Provider Notes (Signed)
-----------------------------------------   7:59 AM on 10/18/2021 -----------------------------------------   Blood pressure 133/81, pulse 79, temperature 97.8 F (36.6 C), temperature source Oral, resp. rate 17, height 1.854 m (6\' 1" ), weight 90.7 kg, SpO2 98 %.  The patient is calm and cooperative at this time.  There have been no acute events since the last update.  Awaiting disposition plan from [facility] team.   [facility], MD 10/18/21 [phone]

## 2021-10-18 NOTE — ED Notes (Signed)
This tech and Beth,RN provided pt with a new condom cath. Pt brief and bed linen dried. Pt got repositioned in bed. No other needs found at this moment.

## 2021-10-18 NOTE — ED Notes (Signed)
Pt in bed with eyes closed, pt arouses to verbal stim, meds given, pt answering yes or no questions.

## 2021-10-18 NOTE — ED Notes (Signed)
Patient is laying quietly in bed watching television. Pt is asking for coffee, however, writer advised that it is 2am in the morning and that he can have coffee when he wakes up. Pt verbalized understanding. No other needs voiced. Siderails up x 4 and call bell within reach.

## 2021-10-18 NOTE — ED Notes (Signed)
RN assisted caregiver with pt due to pt's bed being soiled. Pt was cleaned pericare was done. Pt placed in a new brief. RN offered to place pt in a new gown, pt declined.

## 2021-10-18 NOTE — ED Notes (Signed)
Pt currently laying bed and watching TV. Pt give a cup of coffee per request.

## 2021-10-18 NOTE — ED Notes (Signed)
Family at bedside with pt. Denies any needs at the moment.

## 2021-10-18 NOTE — ED Notes (Signed)
Pt's hospital meal provided. Caregiver states she will assist pt with meals. RN offered assistance.

## 2021-10-18 NOTE — ED Notes (Addendum)
Pt lying in bed and watching television. Caregiver at bedside. No needs at this time.

## 2021-10-18 NOTE — ED Notes (Signed)
Pt in bed, meal tray given, care giver has arrived and states that she will help pt eat and then turn him.  Offered assistance.

## 2021-10-18 NOTE — ED Notes (Signed)
Pt in bed, fed pt some apple sauce, changed gown, re positioned pt.  Pt states that he doesn't want the light on and has no other needs at this time.

## 2021-10-19 DIAGNOSIS — Z741 Need for assistance with personal care: Secondary | ICD-10-CM | POA: Diagnosis not present

## 2021-10-19 NOTE — ED Notes (Signed)
Message regarding medication administration: Hey - would it be possible to schedule this for tomorrow? Pts mother is concerned that he hasn't taken it in a month and does not want him to wait an additional month before taking it again. Thanks This is not an acute care medication and automatically held while inpatient. If patient brings own supply, we can administer tomorrow. Will encourage to discuss with MD an determine if additional and/or immediate therapy is needed. We do not carry ibandronate tablets, but have Boniva injectable form and will need RX by physician.

## 2021-10-19 NOTE — ED Provider Notes (Signed)
Emergency Medicine Observation Re-evaluation Note  Derrick Jordan is a 36 y.o. male, seen on rounds today.  Pt initially presented to the ED for complaints of Urinary Retention Currently, the patient is pending placement   Physical Exam  BP 120/84   Pulse 82   Temp 98.1 F (36.7 C) (Oral)   Resp 17   Ht 6\' 1"  (1.854 m)   Wt 90.7 kg   SpO2 98%   BMI 26.39 kg/m  Physical Exam   ED Course / MDM  EKG:   I have reviewed the labs performed to date as well as medications administered while in observation.  Recent changes in the last 24 hours include none.  Plan  Current plan is for social work dispo.  Derrick Jordan is not under involuntary commitment.     [name], MD 10/19/21 [phone]

## 2021-10-19 NOTE — ED Notes (Signed)
RN did a full linen change on pt. Pt was placed in a new brief and pericare was done. RN offered condom catheter and male pure wick device, pt declined. Rn also offered pt into new gown, pt declined.

## 2021-10-19 NOTE — TOC Progression Note (Signed)
Transition of Care [facility]) - Progression Note    Patient Details  Name: Enes Rokosz MRN: [id number] Date of Birth: November 05, 1985  Transition of Care [facility]) CM/SW Contact  Allayne Butcher, RN Phone Number: 10/19/2021, 9:24 AM  Clinical Narrative:    APS has accepted the case and assigned to Case worker Nancy Fetter- [phone], [place] [facility].  Per Lucienne Minks IDD Care Manager with [facility] [phone] patient's mother has still not signed the consents for them to provide medical information to residential placements, they have started reaching out but cannot do much else until she signs consent forms.     Expected Discharge Plan:  (IFL- or home with adequate home services) Barriers to Discharge: Other (must enter comment) (placement)  Expected Discharge Plan and Services Expected Discharge Plan:  (IFL- or home with adequate home services)   Discharge Planning Services: CM Consult   Living arrangements for the past 2 months: Single Family Home                 DME Arranged: N/A DME Agency: NA                   Social Determinants of Health (SDOH) Interventions    Readmission Risk Interventions     No data to display

## 2021-10-19 NOTE — ED Notes (Signed)
Pt rounds completed.  Pillow adjusted for comfort at this time.  Pt noted to have clean and dry brief at this time.  Denies any other needs.  NAD noted.

## 2021-10-20 DIAGNOSIS — Z741 Need for assistance with personal care: Secondary | ICD-10-CM | POA: Diagnosis not present

## 2021-10-20 LAB — BASIC METABOLIC PANEL
Anion gap: 6 (ref 5–15)
BUN: 11 mg/dL (ref 6–20)
CO2: 28 mmol/L (ref 22–32)
Calcium: 9.5 mg/dL (ref 8.9–10.3)
Chloride: 106 mmol/L (ref 98–111)
Creatinine, Ser: 0.66 mg/dL (ref 0.61–1.24)
GFR, Estimated: >60 mL/min (ref >60)
Glucose, Bld: 91 mg/dL (ref 70–99)
Potassium: 4.1 mmol/L (ref 3.5–5.1)
Sodium: 140 mmol/L (ref 135–145)

## 2021-10-20 LAB — CBC
HCT: 46.4 % (ref 39.0–52.0)
Hemoglobin: 14.9 g/dL (ref 13.0–17.0)
MCH: 28.5 pg (ref 26.0–34.0)
MCHC: 32.1 g/dL (ref 30.0–36.0)
MCV: 88.7 fL (ref 80.0–100.0)
Platelets: 269 10*3/uL (ref 150–400)
RBC: 5.23 MIL/uL (ref 4.22–5.81)
RDW: 12.2 % (ref 11.5–15.5)
WBC: 6.5 10*3/uL (ref 4.0–10.5)
nRBC: 0 % (ref 0.0–0.2)

## 2021-10-20 NOTE — ED Notes (Signed)
Patient hitting this RN with right hand while trying to clean patients face and neck from breakfast

## 2021-10-20 NOTE — ED Provider Notes (Signed)
-----------------------------------------   8:56 AM on 10/20/2021 -----------------------------------------   Blood pressure 122/74, pulse 67, temperature (!) 97 F (36.1 C), temperature source Axillary, resp. rate 18, height 1.854 m (6\' 1" ), weight 90.7 kg, SpO2 99 %.  The patient is calm and cooperative at this time.  There have been no acute events since the last update.  Awaiting disposition plan from [facility] team.   [facility], MD 10/20/21 [phone]

## 2021-10-20 NOTE — ED Notes (Signed)
Patient incontinent of urine upon assessment. Patient had pulled condom cath off and brief half way off. Patient cleaned and bedding changed, gown changed, new pads placed and new brief placed on patient. Patient pulled up in bed and sat up.

## 2021-10-20 NOTE — ED Notes (Signed)
Patient cleaned and bedding, brief, gown and blankets changed.

## 2021-10-20 NOTE — ED Notes (Signed)
Patient found messing with private area and pulled brief off.

## 2021-10-20 NOTE — Progress Notes (Signed)
CSW spoke to Nancy Fetter- [phone], she is working on getting the consent signed. As she was just assigned the case Friday.

## 2021-10-21 DIAGNOSIS — Z741 Need for assistance with personal care: Secondary | ICD-10-CM | POA: Diagnosis not present

## 2021-10-21 NOTE — ED Notes (Signed)
Caregiver placed condom cath at this time.

## 2021-10-21 NOTE — ED Notes (Signed)
Resumed care from amber rn.  Pt alert.  Sitter with pt  meds given.  Pt watching tv.  No acute distress.  Pt ate lunch earlier today per sitter.

## 2021-10-21 NOTE — ED Provider Notes (Signed)
-----------------------------------------   7:57 AM on 10/21/2021 -----------------------------------------   Blood pressure 127/81, pulse 80, temperature (!) 97 F (36.1 C), temperature source Axillary, resp. rate 19, height 1.854 m (6\' 1" ), weight 90.7 kg, SpO2 98 %.  The patient is calm and cooperative at this time.  There have been no acute events since the last update.  Awaiting disposition plan from [facility] team.   [facility], MD 10/21/21 [phone]

## 2021-10-21 NOTE — ED Notes (Signed)
Mother with pt  meds given.  Pt alert.    Pt eating dinner meal

## 2021-10-21 NOTE — ED Notes (Signed)
Lunch tray at bedside. Caregiver with patient in room at this time

## 2021-10-21 NOTE — ED Notes (Addendum)
Patient set up with breakfast tray and eating at this time. This RN helped patient eat breakfast. Apple juice given

## 2021-10-21 NOTE — ED Notes (Signed)
Blanket placed on patient and given coffee

## 2021-10-21 NOTE — ED Notes (Addendum)
Patient cleaned, brief changed, bed sheet changed, gown changed, and pulled up in bed.

## 2021-10-22 DIAGNOSIS — Z741 Need for assistance with personal care: Secondary | ICD-10-CM | POA: Diagnosis not present

## 2021-10-22 NOTE — ED Notes (Signed)
Pt resting quietly with his eyes closed, cont to hold the remote in his hand, resp even and unlabored

## 2021-10-22 NOTE — ED Notes (Signed)
Pt assisted with his breakfast, pt ate all his french toast and this RN assisted him to eat the applesauce and drink his grape juice, a warm blanket was given to the pt upon request as well as the heat turned up in the room, a remote for the tv was found for the patient

## 2021-10-22 NOTE — ED Notes (Signed)
Pt observed resting quietly with his eyes closed, resp even and unlabored

## 2021-10-22 NOTE — ED Provider Notes (Signed)
-----------------------------------------   5:41 AM on 10/22/2021 -----------------------------------------   Blood pressure 130/76, pulse 74, temperature (!) 97 F (36.1 C), temperature source Oral, resp. rate 20, height 6\' 1"  (1.854 m), weight 90.7 kg, SpO2 99 %.  The patient is calm and cooperative at this time.  There have been no acute events since the last update.  Awaiting disposition plan from Social Work team.   , MD 10/22/21 [phone]

## 2021-10-22 NOTE — ED Notes (Signed)
CSW placed call to Nancy Fetter- [phone], CSW left VM requesting call back. TOC to follow.

## 2021-10-22 NOTE — ED Notes (Signed)
Pt's personal caregiver is at bedside

## 2021-10-22 NOTE — ED Notes (Signed)
Pt sitting up in bed, caregiver at bedside, pt drinking a drink from his cup

## 2021-10-23 DIAGNOSIS — Z741 Need for assistance with personal care: Secondary | ICD-10-CM | POA: Diagnosis not present

## 2021-10-23 NOTE — ED Notes (Signed)
Pt given lunch tray. Caregiver at bedside

## 2021-10-23 NOTE — ED Provider Notes (Signed)
-----------------------------------------   5:50 AM on 10/23/2021 -----------------------------------------   Blood pressure 135/89, pulse 78, temperature 98.7 F (37.1 C), temperature source Oral, resp. rate 18, height 6\' 1"  (1.854 m), weight 90.7 kg, SpO2 97 %.  The patient is calm and cooperative at this time.  There have been no acute events since the last update.  Awaiting disposition plan from Social Work team.   , MD 10/23/21 [phone]

## 2021-10-23 NOTE — ED Notes (Signed)
Pt checked - dry brief at this time. No concerns at this time.

## 2021-10-23 NOTE — ED Notes (Signed)
Breakfast tray given. °

## 2021-10-24 DIAGNOSIS — Z741 Need for assistance with personal care: Secondary | ICD-10-CM | POA: Diagnosis not present

## 2021-10-24 NOTE — ED Notes (Signed)
Pt cleaned up and bed linen changed by this Clinical research associate and [place], NT. Pt tolerated well.

## 2021-10-24 NOTE — ED Notes (Signed)
Care giver in room with pt.

## 2021-10-24 NOTE — ED Notes (Signed)
Patient had pulled external catheter off. Incontinent of urine. Pericare performed. Dry brief applied. Linens, gown and blankets changed. Positioned for comfort. No distress noted at this time.

## 2021-10-24 NOTE — ED Provider Notes (Signed)
Emergency Medicine Observation Re-evaluation Note  Derrick Jordan is a 36 y.o. male who continues to reside in the emergency department awaiting placement into an appropriate living facility.  No acute events since last update.  Physical Exam  BP 123/82 (BP Location: Right Arm)   Pulse 64   Temp 97.6 F (36.4 C) (Oral)   Resp 16   Ht 6\' 1"  (1.854 m)   Wt 90.7 kg   SpO2 97%   BMI 26.39 kg/m    ED Course / MDM   Most recent lab work from 10/20/2021 shows a normal CBC and a normal BMP.  Plan  Current plan is for placement to an appropriate living facility once available.  Liliana Brentlinger is not under involuntary commitment.     [name], MD 10/24/21 1139

## 2021-10-24 NOTE — Progress Notes (Signed)
CSW contacted patients APS worker Nancy Fetter  [phone] with no answer. CSW left a message. CSW also contacted Joy's supervisor, [phone] with no answer. Message also left with patients mother with no answer.

## 2021-10-24 NOTE — ED Notes (Signed)
Patient resting quietly in bed with eyes closed. Resp even, unlabored on RA. No distress noted at this time. 

## 2021-10-24 NOTE — ED Notes (Signed)
Pt cleaned and changed after bowel movement. All linens and gown changed.  Mother at bedside.

## 2021-10-25 DIAGNOSIS — Z741 Need for assistance with personal care: Secondary | ICD-10-CM | POA: Diagnosis not present

## 2021-10-25 NOTE — ED Notes (Signed)
Peri care performed and brief changed.

## 2021-10-25 NOTE — ED Notes (Signed)
Pt assisted with breakfast.

## 2021-10-25 NOTE — ED Provider Notes (Signed)
Today's Vitals   10/24/21 0723 10/24/21 2149 10/24/21 2258 10/25/21 0117  BP: 123/82  137/88   Pulse: 64  89   Resp: 16  16   Temp: 97.6 F (36.4 C)  98.3 F (36.8 C)   TempSrc: Oral  Oral   SpO2: 97%  95%   Weight:      Height:      PainSc:  0-No pain 0-No pain 0-No pain   Body mass index is 26.39 kg/m.  No acute events overnight.  Awaiting social work disposition.   Evona Westra, [name], DO 10/25/21 [phone]

## 2021-10-25 NOTE — ED Notes (Signed)
Pt cleaned, full linen change performed and pt wiped down with bath wipes

## 2021-10-25 NOTE — ED Notes (Signed)
Caregiver at bedside feeding pt

## 2021-10-26 DIAGNOSIS — Z741 Need for assistance with personal care: Secondary | ICD-10-CM | POA: Diagnosis not present

## 2021-10-26 NOTE — ED Notes (Signed)
Pt aid changing pt and linens. Pt aid denies any need for help. This RN restocked towels, gowns, bed sheets, and bed pads.

## 2021-10-26 NOTE — ED Notes (Addendum)
This RN changed pt brief, pad and gown. Pt repositioned in bed.

## 2021-10-26 NOTE — ED Notes (Signed)
Breakfast tray placed on bedside table, pt not wanting to sit up to eat at this time. RN Pattricia Boss made aware.

## 2021-10-27 DIAGNOSIS — Z741 Need for assistance with personal care: Secondary | ICD-10-CM | POA: Diagnosis not present

## 2021-10-27 NOTE — ED Notes (Signed)
Patient resting with eyes closed in hospital bed at this time. Respirations even and unlabored. NAD noted.

## 2021-10-27 NOTE — ED Provider Notes (Signed)
Today's Vitals   10/26/21 1415 10/26/21 2216 10/26/21 2225 10/27/21 0426  BP: 124/79 120/68    Pulse: 70 74    Resp: 17     Temp: 98.2 F (36.8 C)     TempSrc: Oral     SpO2: 98%     Weight:      Height:      PainSc: 0-No pain  0-No pain Asleep   Body mass index is 26.39 kg/m.  No acute events overnight.   [name], [name], DO 10/27/21 [phone]

## 2021-10-27 NOTE — ED Notes (Signed)
Meal tray at bedside. Caregiver with patient assisting with eating lunch tray. No needs expressed to RN at this time.

## 2021-10-27 NOTE — ED Notes (Signed)
Patient assisted with eating breakfast at this time.

## 2021-10-27 NOTE — ED Notes (Signed)
Patient given fresh cup of water and cranberry juice at this time as requested. Patient denies being wet or needing to be changed at this time. No other needs expressed to RN at this time.

## 2021-10-28 DIAGNOSIS — Z741 Need for assistance with personal care: Secondary | ICD-10-CM | POA: Diagnosis not present

## 2021-10-28 LAB — CBC
HCT: 47.1 % (ref 39.0–52.0)
Hemoglobin: 15.1 g/dL (ref 13.0–17.0)
MCH: 28 pg (ref 26.0–34.0)
MCHC: 32.1 g/dL (ref 30.0–36.0)
MCV: 87.2 fL (ref 80.0–100.0)
Platelets: 278 10*3/uL (ref 150–400)
RBC: 5.4 MIL/uL (ref 4.22–5.81)
RDW: 12.4 % (ref 11.5–15.5)
WBC: 5.7 10*3/uL (ref 4.0–10.5)
nRBC: 0 % (ref 0.0–0.2)

## 2021-10-28 NOTE — ED Notes (Signed)
Upon checking the patient to see if they need to be changed, pt was dry.

## 2021-10-28 NOTE — ED Notes (Signed)
The pt's head was elevated and his breakfast tray was placed in front of him. The pt was assisted with pouring his drinks into a cup and peeling his banana. The pt was given coffee in his yeti cup upon request. Ice was placed in the cup to slightly cool the coffee. A towel was placed under the pt's chin area to catch food.

## 2021-10-28 NOTE — ED Notes (Signed)
Pt was lying supine in the bed with his head slightly elevated. The pt was warm, pink, and dry. The pt was alert to his baseline advising me to cut off the Shafin Pollio. The pt's brief was found to be dry. The pt advised he didn't need anything and to turn his Kassie Keng out.

## 2021-10-28 NOTE — ED Notes (Signed)
Aid still present in room with the pt at the bedside. The aid was asked if any assistance was needed, she advised no.

## 2021-10-28 NOTE — ED Notes (Signed)
The pt's aid is at the bedside. She advised the pt had eaten his breakfast. The aid advised no assistance was needed.

## 2021-10-28 NOTE — ED Notes (Signed)
Patient resting comfortably in bed. Resp even, regular, unlabored on RA. Attempted to change patient but patient states he is dry. Linen, chux and brief all dry. Juice provided to patient. Patient denies additional needs. No distress noted at this time.

## 2021-10-29 DIAGNOSIS — Z741 Need for assistance with personal care: Secondary | ICD-10-CM | POA: Diagnosis not present

## 2021-10-29 NOTE — ED Notes (Signed)
Pt given breakfast tray and assisted with eating. Pt given one cup of grape juice and his water cup filled back up with water. Pt asked for the lights to be cut off and his door shut. Will continue to monitor.

## 2021-10-29 NOTE — ED Notes (Signed)
Pt given lunch tray and has home aid at bedside assisting with feeding. Asked if they need any further assistance and they do not at this time.

## 2021-10-29 NOTE — ED Notes (Signed)
Pt resting in bed with TV on and talking to his caregiver.

## 2021-10-29 NOTE — ED Notes (Signed)
Patient incontinent of urine. Peri care performed. Linens, chux and brief changed. Denies additional needs at this time.

## 2021-10-29 NOTE — TOC Progression Note (Signed)
Transition of Care [facility]) - Progression Note    Patient Details  Name: Derrick Jordan MRN: [id number] Date of Birth: [birth year]  Transition of Care [facility]) CM/SW Contact  Allayne Butcher, RN Phone Number: 10/29/2021, 1:57 PM  Clinical Narrative:    Patient's mother finally signed the consents for [facility] to give information to potential placements, group homes, or alternative living homes.  No SNF offers from SNF search.  Reached out to Joy with [place] [facility] but she has no updated information, she will be reaching out to the patient's mother.     Expected Discharge Plan:  (IFL- or home with adequate home services) Barriers to Discharge: Other (must enter comment) (placement)  Expected Discharge Plan and Services Expected Discharge Plan:  (IFL- or home with adequate home services)   Discharge Planning Services: CM Consult   Living arrangements for the past 2 months: Single Family Home                 DME Arranged: N/A DME Agency: NA                   Social Determinants of Health (SDOH) Interventions    Readmission Risk Interventions     No data to display

## 2021-10-29 NOTE — ED Notes (Signed)
Patient resting quietly in bed with eyes closed, appears sleeping. Resp even, regular, unlabored on RA. No distress noted at this time.

## 2021-10-30 DIAGNOSIS — Z741 Need for assistance with personal care: Secondary | ICD-10-CM | POA: Diagnosis not present

## 2021-10-30 NOTE — ED Notes (Signed)
Pt brief and sheets changed at this time °

## 2021-10-30 NOTE — ED Notes (Signed)
Caretaker at bedside. 

## 2021-10-30 NOTE — TOC Progression Note (Signed)
Transition of Care [facility]) - Progression Note    Patient Details  Name: Clarence Cogswell MRN: [id number] Date of Birth: 1985-06-24  Transition of Care [facility]) CM/SW Contact  Allayne Butcher, RN Phone Number: 10/30/2021, 3:38 PM  Clinical Narrative:    Celesta Gentile, RN Care Manager with [facility] reached out via email for more information on patient in ED.  She is one of the team members with [facility] helping with placement.  RNCM has been in contact with Brianne Ellin Goodie Supervisor.   Called her and left a message for return call and responded to [facility] email as well.    Expected Discharge Plan:  (IFL- or home with adequate home services) Barriers to Discharge: Other (must enter comment) (placement)  Expected Discharge Plan and Services Expected Discharge Plan:  (IFL- or home with adequate home services)   Discharge Planning Services: CM Consult   Living arrangements for the past 2 months: Single Family Home                 DME Arranged: N/A DME Agency: NA                   Social Determinants of Health (SDOH) Interventions    Readmission Risk Interventions     No data to display

## 2021-10-31 DIAGNOSIS — Z741 Need for assistance with personal care: Secondary | ICD-10-CM | POA: Diagnosis not present

## 2021-10-31 NOTE — ED Notes (Signed)
TOC placement 

## 2021-10-31 NOTE — ED Provider Notes (Signed)
-----------------------------------------   5:20 AM on 10/31/2021 -----------------------------------------   Blood pressure 130/71, pulse 76, temperature 98.6 F (37 C), resp. rate 16, height 6\' 1"  (1.854 m), weight 90.7 kg, SpO2 98 %.  The patient is calm and cooperative at this time.  There have been no acute events since the last update.  Awaiting disposition plan from Social Work team.   , MD 10/31/21 [phone]

## 2021-10-31 NOTE — ED Notes (Signed)
Pt is clean and dry.  

## 2021-10-31 NOTE — ED Notes (Signed)
Caregiver arrived.

## 2021-10-31 NOTE — ED Notes (Signed)
Pt brief clean and dry at this time, will continue to round

## 2021-11-01 DIAGNOSIS — Z741 Need for assistance with personal care: Secondary | ICD-10-CM | POA: Diagnosis not present

## 2021-11-01 NOTE — ED Notes (Signed)
RN rounded on pt. Pt currently in bed watching TV. Pt's personal caregiver at bedside. Pt given warm blankets, ginger-ale, and grape juice per request.

## 2021-11-01 NOTE — ED Notes (Signed)
Pt changed into clean linen, brief, and gown.

## 2021-11-01 NOTE — ED Notes (Signed)
Pt's caregiver currently at bedside. Pt's hospital meal provided. Pt given ginger-ale per request. Caregiver assisting pt with meal feeding.

## 2021-11-01 NOTE — ED Provider Notes (Signed)
Emergency Medicine Observation Re-evaluation Note  Derrick Jordan is a 36 y.o. male, initially presented to the ED for complaints of Urinary Retention No acute events overnight.  Physical Exam  BP (!) 120/58 (BP Location: Right Arm)   Pulse 76   Temp 98.3 F (36.8 C) (Oral)   Resp 18   Ht 6\' 1"  (1.854 m)   Wt 90.7 kg   SpO2 98%   BMI 26.39 kg/m    ED Course / MDM   No recent lab work for review.  CBC from 10/28/2021 showed no acute findings.  Plan  Current plan is for placement to an appropriate living facility once available.  Derrick Jordan is not under involuntary commitment.     [name], MD 11/01/21 2016

## 2021-11-01 NOTE — ED Notes (Signed)
Pt is clean and dry. Requested light be turned off at this time.

## 2021-11-02 DIAGNOSIS — Z741 Need for assistance with personal care: Secondary | ICD-10-CM | POA: Diagnosis not present

## 2021-11-02 NOTE — ED Notes (Signed)
Dinner tray at bedside

## 2021-11-02 NOTE — ED Notes (Signed)
Patients caregiver at bedside giving bed bath at this time

## 2021-11-02 NOTE — ED Notes (Signed)
Mom at bedside.

## 2021-11-02 NOTE — ED Notes (Signed)
Pt mother in room at this time.

## 2021-11-02 NOTE — ED Notes (Signed)
RN did a full bed linen change. Pt is clean and dry. Pt brief changed and placed into new gown.

## 2021-11-02 NOTE — ED Provider Notes (Signed)
-----------------------------------------   5:27 AM on 11/02/2021 -----------------------------------------   Blood pressure 111/85, pulse 81, temperature 98.2 F (36.8 C), temperature source Oral, resp. rate 18, height 6\' 1"  (1.854 m), weight 90.7 kg, SpO2 97 %.  The patient is calm and cooperative at this time.  There have been no acute events since the last update.  Awaiting disposition plan from Social Work team.   , MD 11/02/21 [phone]

## 2021-11-02 NOTE — ED Notes (Signed)
Pt lying in bed and watching TV. Pt denies any needs at this time.

## 2021-11-02 NOTE — ED Notes (Signed)
Spoke with pharmacy regarding Boniva. Pharmacists reports they are holding no medication brought in by mom in pharmacy.

## 2021-11-02 NOTE — ED Notes (Signed)
This RN spoke with Rayfield Citizen in pharmacy about pt 1x month Boniva. Per mother, she has brought his dose in multiple times and staff member sent to pharmacy but pt has not received dose since April. Per Rayfield Citizen, pharmacy staff will be able to look into it tomorrow when staffing is better.

## 2021-11-02 NOTE — ED Notes (Signed)
Patient eating lunch tray at this time. Sitter at bedside with patient

## 2021-11-03 DIAGNOSIS — Z741 Need for assistance with personal care: Secondary | ICD-10-CM | POA: Diagnosis not present

## 2021-11-03 NOTE — ED Notes (Signed)
This RN provided pericare, dry disposable brief, linen change, and warm blanket at this time.  Pt alert, calm, and cooperative.

## 2021-11-03 NOTE — ED Notes (Signed)
Pt resting in bed watching TV, alert and calm.  Diet gingerale given at this time per pt request.

## 2021-11-03 NOTE — ED Provider Notes (Signed)
Emergency Medicine Observation Re-evaluation Note  Derrick Jordan is a 36 y.o. male, currently residing in the emergency department awaiting placement to an appropriate living facility.  No acute events since last update.  Physical Exam  BP 115/76 (BP Location: Right Arm)   Pulse 78   Temp (!) 97.4 F (36.3 C) (Oral)   Resp 16   Ht 6\' 1"  (1.854 m)   Wt 90.7 kg   SpO2 97%   BMI 26.39 kg/m    ED Course / MDM   Lab work from approximately 1 week ago showed a normal CBC, from earlier this month showed a normal chemistry.  No recent lab work for review.  Plan  Current plan is for placement to an appropriate living facility once available.  Tegan Burnside is not under involuntary commitment.     [name], MD 11/03/21 2111

## 2021-11-04 DIAGNOSIS — Z741 Need for assistance with personal care: Secondary | ICD-10-CM | POA: Diagnosis not present

## 2021-11-04 NOTE — ED Notes (Signed)
Pt's personal caregiver at bedside. Pt alert and calmly resting on stretcher watching tv. Pt has his glasses on.

## 2021-11-04 NOTE — ED Notes (Signed)
Pt denies any needs currently. Caregiver remains with pt.

## 2021-11-04 NOTE — TOC Progression Note (Signed)
Transition of Care [facility]) - Progression Note    Patient Details  Name: Derrick Jordan MRN: [id number] Date of Birth: February 22, 1986  [facility]) CM/SW Contact  Gildardo Griffes, [place] Phone Number: 11/04/2021, 9:54 AM  Clinical Narrative:     CSW called and spoke with Lucienne Minks IDD Care Manager with [facility] [phone] she reports that patient's mother is doing an in person tour of a [facility] [facility] called [facility] on Thursday 7/20. She reports if patient's mother does not approve of group home, back up plan would be for patient to go home. Per Dena Billet, they are working on gathering 2 agencies to provide the 84 hours of home care mom is requesting. They have onboarded 1 agency, reports they have a second agency that is working on gathering enough staf, they will be through with onboarding in 1 week. Pending tour Thursday of group home to decide discharge venue of group home vs home with home care.   Case worker Nancy [phone],  [facility] has been updated on the above.      Expected Discharge Plan:  (IFL- or home with adequate home services) Barriers to Discharge: Other (must enter comment) (placement)  Expected Discharge Plan and Services Expected Discharge Plan:  (IFL- or home with adequate home services)   Discharge Planning Services: CM Consult   Living arrangements for the past 2 months: Single Family Home                 DME Arranged: N/A DME Agency: NA                   Social Determinants of Health (SDOH) Interventions    Readmission Risk Interventions     No data to display

## 2021-11-04 NOTE — ED Notes (Signed)
Pt feeding self. Provided orange juice with ice. Pt drank water with miralax. Peri care performed and gown and chux changed on pt.

## 2021-11-04 NOTE — ED Provider Notes (Signed)
Emergency Medicine Observation Re-evaluation Note  Derrick Jordan is a 36 y.o. male, seen on rounds today.  Pt initially presented to the ED for complaints of Urinary Retention Currently, the patient is resting comfortably.   Physical Exam  BP 114/70   Pulse 71   Temp 98.2 F (36.8 C) (Oral)   Resp 16   Ht 6\' 1"  (1.854 m)   Wt 90.7 kg   SpO2 98%   BMI 26.39 kg/m  Physical Exam General: no distress, chronically ill appearing Lungs: no resp distress Psych: no agitation   ED Course / MDM  EKG:   I have reviewed the labs performed to date as well as medications administered while in observation.  Recent changes in the last 24 hours include none.   Plan  Current plan is for SW placement.  Derrick Jordan is not under involuntary commitment.     [name], MD 11/04/21 1626

## 2021-11-04 NOTE — ED Notes (Signed)
Pt requesting coffee. Pt denies any needs currently. Caregiver given more linens, bed pads, bath wipes, socks, gowns and warm blankets as requested.

## 2021-11-04 NOTE — ED Notes (Signed)
Pt alert and calmly laying on stretcher. Caregiver with pt.

## 2021-11-05 DIAGNOSIS — Z741 Need for assistance with personal care: Secondary | ICD-10-CM | POA: Diagnosis not present

## 2021-11-05 NOTE — ED Notes (Signed)
Breakfast tray given. Pt eating.

## 2021-11-05 NOTE — ED Notes (Signed)
Caregiver arrived at bedside.

## 2021-11-05 NOTE — ED Notes (Signed)
Rn to bedside. Pt sleeping. Offcoming advised they just changed his sheets and brief.

## 2021-11-05 NOTE — ED Provider Notes (Signed)
-----------------------------------------   6:17 AM on 11/05/2021 -----------------------------------------   Blood pressure 135/78, pulse 81, temperature 98 F (36.7 C), temperature source Oral, resp. rate 20, height 6\' 1"  (1.854 m), weight 90.7 kg, SpO2 98 %.  The patient is calm and cooperative at this time.  There have been no acute events since the last update.  Awaiting disposition plan from Social Work team.   , MD 11/05/21 [phone]

## 2021-11-05 NOTE — ED Provider Notes (Signed)
Emergency Medicine Observation Re-evaluation Note  Derrick Jordan is a 36 y.o. male, seen on rounds today.  Pt initially presented to the ED for complaints of Urinary Retention   Physical Exam  BP 132/74   Pulse 80   Temp 98.3 F (36.8 C) (Oral)   Resp 18   Ht 6\' 1"  (1.854 m)   Wt 90.7 kg   SpO2 98%   BMI 26.39 kg/m  Physical Exam  Psych: calm  ED Course / MDM  EKG:   I have reviewed the labs performed to date as well as medications administered while in observation.  Recent changes in the last 24 hours include none.  Plan  Current plan is for social work dispo.  Derrick Jordan is not under involuntary commitment.     [name], MD 11/05/21 [phone]

## 2021-11-06 DIAGNOSIS — Z741 Need for assistance with personal care: Secondary | ICD-10-CM | POA: Diagnosis not present

## 2021-11-07 DIAGNOSIS — Z741 Need for assistance with personal care: Secondary | ICD-10-CM | POA: Diagnosis not present

## 2021-11-07 NOTE — ED Notes (Signed)
Pt provided breakfast tray and stating the only medication he will take his celexa and tylenol. Pt refused all other medication RN attempting to administer.

## 2021-11-07 NOTE — TOC Progression Note (Signed)
Transition of Care [facility]) - Progression Note    Patient Details  Name: Derrick Jordan MRN: [id number] Date of Birth: January 20, 1986  [facility]) CM/SW Contact  Allayne Butcher, RN Phone Number: 11/07/2021, 2:50 PM  Clinical Narrative:    Per Lucienne Minks with [facility], the mom toured the home but they do not have a handicapped shower so it would be not be an appropriate placement.  Brianne reports that the home care agencies are almost fully staffed so they still hope for patient to go home, maybe within the next 2 weeks.    Expected Discharge Plan:  (IFL- or home with adequate home services) Barriers to Discharge: Other (must enter comment) (placement)  Expected Discharge Plan and Services Expected Discharge Plan:  (IFL- or home with adequate home services)   Discharge Planning Services: CM Consult   Living arrangements for the past 2 months: Single Family Home                 DME Arranged: N/A DME Agency: NA                   Social Determinants of Health (SDOH) Interventions    Readmission Risk Interventions     No data to display

## 2021-11-07 NOTE — ED Provider Notes (Signed)
-----------------------------------------   7:04 AM on 11/07/2021 -----------------------------------------   Blood pressure 117/72, pulse 81, temperature 97.9 F (36.6 C), temperature source Oral, resp. rate 18, height 1.854 m (6\' 1" ), weight 90.7 kg, SpO2 100 %.  The patient is calm and cooperative at this time.  There have been no acute events since the last update.  Awaiting disposition plan from [facility] team.   [facility], MD 11/07/21 [phone]

## 2021-11-07 NOTE — ED Notes (Signed)
Caregiver at bedside

## 2021-11-07 NOTE — ED Notes (Signed)
This tech, Majel Homer at bedside cleaning pt up. Clean bed sheets placed on bed, external catheter and new brief back on pt. Mother remains at bedside. No other needs voiced at this time.

## 2021-11-08 DIAGNOSIS — Z741 Need for assistance with personal care: Secondary | ICD-10-CM | POA: Diagnosis not present

## 2021-11-08 NOTE — ED Notes (Signed)
Pt nurse aid in room feeding pt lunch

## 2021-11-08 NOTE — ED Notes (Signed)
Pt nurse aid giving pt bath.

## 2021-11-08 NOTE — ED Notes (Signed)
Pt mother fed patient dinner.

## 2021-11-08 NOTE — ED Notes (Signed)
This RN to bedside, pt resting in bed with lights dimmed. Pt requesting remote to watch TV. Pt given remote. Pt states he is dry at this time. Pt denies further needs. Call bell within reach of patient at this time.

## 2021-11-08 NOTE — ED Notes (Signed)
Meal tray placed at bedside. Patient still resting, requesting to continue sleeping at this time.

## 2021-11-08 NOTE — ED Notes (Signed)
Patient still requesting to sleep, refusing meds at this time. Agreeable to have vital signs taken.

## 2021-11-08 NOTE — ED Notes (Signed)
Pt requesting coffee. Coffee provided by pt home nurses aid.

## 2021-11-08 NOTE — ED Notes (Signed)
Pt changed into clean brief, pericare provided and chucks changed.

## 2021-11-08 NOTE — ED Notes (Signed)
Mother at bedside feeding pt panera and shrimp

## 2021-11-09 DIAGNOSIS — Z741 Need for assistance with personal care: Secondary | ICD-10-CM | POA: Diagnosis not present

## 2021-11-09 NOTE — ED Notes (Signed)
This RN changed pt in to dry brief, provided complete linen change, and peri care assisted by pt mother.  Pt given diet gingerale at this time per his request.

## 2021-11-09 NOTE — ED Notes (Signed)
Pt able to use urinal with assistance at this time.

## 2021-11-09 NOTE — ED Notes (Addendum)
Attempted to cleanse pt of incontinence at 0400 and at this time. Pt refusing to allow this RN to clean pt both times. Pt swatting at this RN. RN replaced blankets as they were the only thing this RN was able to replace without being physically abused.

## 2021-11-09 NOTE — ED Notes (Signed)
Pt eating lunch

## 2021-11-09 NOTE — ED Notes (Signed)
This RN provided pt with pericare, dry incontinence brief and chux pad change.  Pt given diet gingerale with med administration.  Pt calm and cooperative.

## 2021-11-09 NOTE — ED Provider Notes (Signed)
-----------------------------------------   6:06 AM on 11/09/2021 -----------------------------------------  Vitals:   11/08/21 1541 11/08/21 2200  BP: 132/78 114/78  Pulse: 74 76  Resp: 19 17  Temp: 98.8 F (37.1 C) 98.1 F (36.7 C)  SpO2: 98% 97%     The patient is calm and resting without distress is currently sleeping.  There have been no acute events since the last update.  Awaiting disposition plan from [facility] team.     Sharyn Creamer, MD 11/09/21 [phone]

## 2021-11-09 NOTE — ED Notes (Signed)
Pt linens changed, brief changed. Given warm blankets. Tv turned on. Eating breakfast at this time.

## 2021-11-10 DIAGNOSIS — Z741 Need for assistance with personal care: Secondary | ICD-10-CM | POA: Diagnosis not present

## 2021-11-10 NOTE — ED Provider Notes (Addendum)
Vitals:   11/09/21 1955 11/10/21 0705  BP: 128/70 130/77  Pulse: 91 88  Resp: 16 17  Temp: 98.2 F (36.8 C) 98.3 F (36.8 C)  SpO2: 97% 96%   He is in no distress.  Alert to room entry.  Patient woke up when I entered the room.  He motions and gestures toward me that he is doing fine, but wants to continue to sleep and to turn the lights off and leave him.   Sharyn Creamer, MD 11/10/21 5537    Sharyn Creamer, MD 11/10/21 [phone]

## 2021-11-11 DIAGNOSIS — Z741 Need for assistance with personal care: Secondary | ICD-10-CM | POA: Diagnosis not present

## 2021-11-11 NOTE — ED Notes (Signed)
Breakfast tray and beverage provided 

## 2021-11-12 DIAGNOSIS — Z741 Need for assistance with personal care: Secondary | ICD-10-CM | POA: Diagnosis not present

## 2021-11-12 NOTE — ED Notes (Signed)
Pt given water 

## 2021-11-12 NOTE — ED Notes (Signed)
Caregiver at bedside and changed linens and cleaned pt. New brief in place

## 2021-11-12 NOTE — ED Notes (Signed)
Pt with soiled brief. Linens and brief changed. Chux placed under pt. Pt refusing to wear condom cath or male purewick.

## 2021-11-12 NOTE — ED Notes (Signed)
Pt asleep at this time. Equal rise and fall of the chest

## 2021-11-12 NOTE — ED Notes (Signed)
Patient resting in bed free from sign of distress. Breathing unlabored speaking in full sentences with symmetric chest rise and fall. Bed low and locked with side rails raised x2. Call bell in reach and monitor in place.   

## 2021-11-12 NOTE — ED Notes (Signed)
Pt brief clean and dry at this time

## 2021-11-12 NOTE — ED Notes (Signed)
Patient resting in hospital bed free from sign of distress. Breathing unlabored with symmetric chest rise and fall. Denies pain at this time. Pt given night time medication and VS updated. Pt requested coffee and RN assisted pt with drinking it. Patient bed is low and locked with side rails raised. Table, call light, and belongings all within reach on pt R side. Pt denies further needs at this time.

## 2021-11-12 NOTE — ED Provider Notes (Signed)
-----------------------------------------   6:51 AM on 11/12/2021 -----------------------------------------   Blood pressure 133/77, pulse 77, temperature 98.2 F (36.8 C), temperature source Oral, resp. rate 17, height 1.854 m (6\' 1" ), weight 90.7 kg, SpO2 100 %.  The patient is calm and cooperative at this time.  There have been no acute events since the last update.  Awaiting disposition plan from [facility] team.   [facility], MD 11/12/21 [phone]

## 2021-11-12 NOTE — ED Notes (Signed)
Patient incontinent of urine. Pt cleansed and linens changed. Pt repositioned in bed for comfort and denies other needs. Lights turned off per pt request and bed is low and locked. Pt watching tv. Breathing noted to be unlabored with symmetric chest rise/fall. Denies pain.

## 2021-11-13 DIAGNOSIS — Z741 Need for assistance with personal care: Secondary | ICD-10-CM | POA: Diagnosis not present

## 2021-11-13 MED ORDER — IBANDRONATE SODIUM 150 MG PO TABS
150.0000 mg | ORAL_TABLET | ORAL | Status: DC
  Filled 2021-11-13: qty 1

## 2021-11-13 NOTE — ED Notes (Signed)
Patient resting in bed free from sign of distress. Breathing unlabored  with symmetric chest rise and fall. Bed low and locked with side rails raised x2. Call bell in reach and monitor in place.   

## 2021-11-13 NOTE — ED Notes (Signed)
Caregiver in room with pt preparing to assist him to eat dinner. No further needs addressed by pt or caregiver.

## 2021-11-13 NOTE — ED Notes (Signed)
Pt sleeping in bed free from sign of distress. Pt noted to be wet and incontinent of urine. Pt cleansed and new linens and gown applied. New brief applied as well. Pt repositioned for comfort with various pillows placed/moved to pad bony prominences. Pt now sleeping again. Breathing unlabored with symmetric chest rise and fall. Bed low and locked with rails raised. Call bell in reach.

## 2021-11-13 NOTE — ED Notes (Signed)
Pt meal tray given and pt ate 80%. Pt aid at bedside giving pt bath and changing gown.

## 2021-11-13 NOTE — ED Notes (Addendum)
Patient resting in bed free from sign of distress. Breathing unlabored  with symmetric chest rise and fall. Bed low and locked with side rails raised x2. Call bell in reach and monitor in place.   

## 2021-11-13 NOTE — ED Notes (Signed)
Spoke with pt mother about how Pt aid was able to give Boniva medication if it was not supplied by pharmacy. Pt mother states she is not sure how the medication got in there but he did receive medication and does not need another dose. Teacher, music.

## 2021-11-13 NOTE — ED Notes (Signed)
Patient resting in bed free from sign of distress. Breathing unlabored  with symmetric chest rise and fall. Bed low and locked with side rails raised x2. Call bell in reach  

## 2021-11-14 DIAGNOSIS — Z741 Need for assistance with personal care: Secondary | ICD-10-CM | POA: Diagnosis not present

## 2021-11-14 NOTE — ED Notes (Signed)
Mother of pt still at bedside.

## 2021-11-14 NOTE — ED Provider Notes (Signed)
-----------------------------------------   6:41 AM on 11/14/2021 -----------------------------------------   Blood pressure 120/82, pulse 80, temperature 97.8 F (36.6 C), temperature source Axillary, resp. rate 18, height 6\' 1"  (1.854 m), weight 90.7 kg, SpO2 99 %.  The patient is calm and cooperative at this time.  There have been no acute events since the last update.  Awaiting disposition plan from Social Work team.   , MD 11/14/21 [phone]

## 2021-11-14 NOTE — TOC Progression Note (Signed)
Transition of Care [facility]) - Progression Note    Patient Details  Name: Derrick Jordan MRN: [id number] Date of Birth: January 12, 1986  Transition of Care [facility]) CM/SW Contact  Allayne Butcher, RN Phone Number: 11/14/2021, 9:18 AM  Clinical Narrative:    Update received from [facility] yesterday, they are continuing to look for residential placement but are in the interim working on staffing with [facility] and [facility] to get patient home until residential placement can be found.  Laurena Bering has a meet and greet with [facility] in [place] on Aug 1st.  This home has several other non ambulatory patient's and they feel it would be a good fit for [place].     Expected Discharge Plan:  (IFL- or home with adequate home services) Barriers to Discharge: Other (must enter comment) (placement)  Expected Discharge Plan and Services Expected Discharge Plan:  (IFL- or home with adequate home services)   Discharge Planning Services: CM Consult   Living arrangements for the past 2 months: Single Family Home                 DME Arranged: N/A DME Agency: NA                   Social Determinants of Health (SDOH) Interventions    Readmission Risk Interventions     No data to display

## 2021-11-14 NOTE — ED Notes (Signed)
Mother of pt came out of room wearing hospital gown over her clothes stating that staff has not been in room enough to help and that she cannot clean up pt by herself with home health aid because she has orthopedic problems. Informed her staff will come as soon as they can and that it is shift change in a few minutes. Pt asking what time staff will come in. Informed her staff will come after shift change as soon as possible but that there are multiple patients in this assignment.

## 2021-11-14 NOTE — ED Notes (Signed)
Pts mother in the RM at 5pm. Pts mother asked me if Dejion would be able to go to a dentist appointment next week. I advised I had no idea, maybe the Interior and spatial designer.

## 2021-11-15 DIAGNOSIS — Z741 Need for assistance with personal care: Secondary | ICD-10-CM | POA: Diagnosis not present

## 2021-11-15 NOTE — ED Provider Notes (Signed)
-----------------------------------------   5:32 AM on 11/15/2021 -----------------------------------------   Blood pressure 132/75, pulse 83, temperature 98.1 F (36.7 C), temperature source Oral, resp. rate 18, height 6\' 1"  (1.854 m), weight 90.7 kg, SpO2 96 %.  The patient is calm and cooperative at this time.  There have been no acute events since the last update.  Awaiting disposition plan from Social Work team.   , MD 11/15/21 [phone]

## 2021-11-15 NOTE — ED Notes (Signed)
This RN has been taking care of ER patients and has not been able to go into room for little while. Mother arrived at 5pm or shortly before--arrival was documented in a note. Mother of pt was in hallway asking why primary RN had not been in room. ED secretary explained to her that primary RN has been taking care of several other ER pts. This RN did provide care to pt all day and pt received all medications, VS, etc.

## 2021-11-15 NOTE — ED Notes (Signed)
Pt eating watermelon.

## 2021-11-15 NOTE — ED Notes (Signed)
Mother of pt at bedside.

## 2021-11-15 NOTE — ED Notes (Signed)
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 Overdue buspar will be given as soon as possible.

## 2021-11-16 DIAGNOSIS — Z741 Need for assistance with personal care: Secondary | ICD-10-CM | POA: Diagnosis not present

## 2021-11-16 NOTE — ED Provider Notes (Signed)
Today's Vitals   11/15/21 2234 11/15/21 2340 11/16/21 0113 11/16/21 0221  BP:      Pulse:      Resp:      Temp:      TempSrc:      SpO2:      Weight:      Height:      PainSc: 0-No pain 0-No pain Asleep Asleep   Body mass index is 26.39 kg/m.   No acute events.  Awaiting social work disposition.   Derrick Jordan, [name], DO 11/16/21 [phone]

## 2021-11-16 NOTE — ED Notes (Signed)
Assumed care, pt resting with eyes closed, Chest rise and fall noted. Will continue to monitor.

## 2021-11-16 NOTE — ED Notes (Signed)
Pt with no needs at this time, took meds well. Will monitor.

## 2021-11-16 NOTE — ED Notes (Signed)
Pt brief, sheets, and gown changed at this time

## 2021-11-17 DIAGNOSIS — Z741 Need for assistance with personal care: Secondary | ICD-10-CM | POA: Diagnosis not present

## 2021-11-17 NOTE — ED Provider Notes (Signed)
Most recent vitals Blood pressure 123/74, pulse 84, temperature 97.7 F (36.5 C), temperature source Oral, resp. rate 19, height 6\' 1"  (1.854 m), weight 90.7 kg, SpO2 98 %.   Patient calm at this time. No recent labwork to interpret. Awaiting social work disposition.   , MD 11/17/21 [phone]

## 2021-11-17 NOTE — TOC Progression Note (Signed)
Transition of Care [facility]) - Progression Note    Patient Details  Name: Derrick Jordan MRN: [id number] Date of Birth: [birth year]  Transition of Care [facility]) CM/SW Contact  Allayne Butcher, RN Phone Number: 11/17/2021, 2:35 PM  Clinical Narrative:    Latest updates from [facility]  Here is the daily update as requested for [name]:  Referral packet sent to [facility] group homes Referral packet sent to [facility] Referral packet sent to [facility] CM and CM extender continue to outreach residential homes that have not been explored Virtual meet and greet with [facility] still scheduled for 11/18/21 Next meeting with providers for in-home services scheduled 11/19/21     Expected Discharge Plan:  (IFL- or home with adequate home services) Barriers to Discharge: Other (must enter comment) (placement)  Expected Discharge Plan and Services Expected Discharge Plan:  (IFL- or home with adequate home services)   Discharge Planning Services: CM Consult   Living arrangements for the past 2 months: Single Family Home                 DME Arranged: N/A DME Agency: NA                   Social Determinants of Health (SDOH) Interventions    Readmission Risk Interventions     No data to display

## 2021-11-18 DIAGNOSIS — Z741 Need for assistance with personal care: Secondary | ICD-10-CM | POA: Diagnosis not present

## 2021-11-18 DIAGNOSIS — Z20822 Contact with and (suspected) exposure to covid-19: Secondary | ICD-10-CM | POA: Diagnosis not present

## 2021-11-18 DIAGNOSIS — R339 Retention of urine, unspecified: Secondary | ICD-10-CM | POA: Diagnosis not present

## 2021-11-18 NOTE — ED Notes (Signed)
Pt brief and gown changed at this time

## 2021-11-18 NOTE — ED Provider Notes (Signed)
Today's Vitals   11/18/21 0211 11/18/21 0212 11/18/21 0426 11/18/21 0631  BP:      Pulse:      Resp:      Temp:      TempSrc:      SpO2:      Weight:      Height:      PainSc: Asleep Asleep Asleep Asleep   Body mass index is 26.39 kg/m.   No acute events overnight.  Awaiting social work disposition.   Lynette Noah, [name], DO 11/18/21 0700

## 2021-11-18 NOTE — ED Notes (Signed)
Checked back in on patient. He continues to eat and requested an additional cup of coffee which was provided. Will check back in.

## 2021-11-18 NOTE — ED Notes (Signed)
Pt private care aid at beside at this time.

## 2021-11-18 NOTE — ED Notes (Signed)
Woke pt for his morning meds. Pt took meds without difficulty. Pt requested coffee then changed his mind stating he wanted to nap a little longer. Checked patients brief which was dry as well as offered the use of a urinal. Pt declined need to void at this time. Pt requested this nurse turn the computer off due to the brightness of the computer screen. This nurse turned the computer so he was unable to see the screen. Pt satisfied with this. Pt breakfast brought in a this time and is not ready to eat. Will check back to see if he is ready in about 30 minutes.

## 2021-11-18 NOTE — ED Notes (Signed)
PT brief continues to be dry. Pts food warmed up, pt sat up, meds given without difficulty, food prepared for patient to be able to feed self. Additional cup of coffee brought for patient. Pt doesn't require this nurse to eat per patient. Will check back in on patient in a few minutes.

## 2021-11-19 DIAGNOSIS — Z741 Need for assistance with personal care: Secondary | ICD-10-CM | POA: Diagnosis not present

## 2021-11-19 NOTE — ED Notes (Signed)
HOB raised and breakfast tray provided on bedside table. Pt feeding self. More water provided. Brief checked and is dry. No condom cath in place.

## 2021-11-19 NOTE — ED Notes (Signed)
Patient incontinent of urine. Linens changed, gown changed. Pericare performed and dry brief applied. No distress noted at this time.

## 2021-11-19 NOTE — ED Notes (Signed)
Home health aid at bedside. Pt falling asleep. Brief is dry.

## 2021-11-19 NOTE — ED Notes (Signed)
Patient sitting upright in bed watching television. Resp even, unlabored on RA. Patient clean and dry. No distress noted at this time.

## 2021-11-19 NOTE — ED Notes (Signed)
Patient resting quietly in bed with eyes closed, appears sleeping. Resp even, unlabored on RA. No distress noted at this time. ?

## 2021-11-19 NOTE — ED Provider Notes (Signed)
Emergency Medicine Observation Re-evaluation Note  Derrick Jordan is a 36 y.o. male, who is currently residing in the emergency department awaiting for appropriate living facility.  No acute events since last update.  Physical Exam  BP 107/77 (BP Location: Left Arm)   Pulse 69   Temp 98.3 F (36.8 C) (Oral)   Resp 14   Ht 6\' 1"  (1.854 m)   Wt 90.7 kg   SpO2 97%   BMI 26.39 kg/m    ED Course / MDM   No recent lab work for review  Plan  Current plan is for placement to an appropriate living facility once available.  Derrick Jordan is not under involuntary commitment.     Derrick Kobus, MD 11/19/21 1558

## 2021-11-19 NOTE — TOC Progression Note (Signed)
Transition of Care [facility]) - Progression Note    Patient Details  Name: Derrick Jordan MRN: [id number] Date of Birth: 04/15/1986  Transition of Care [facility]) CM/SW Contact  Allayne Butcher, RN Phone Number: 11/19/2021, 4:11 PM  Clinical Narrative:    Update from Laurena Bering  Here is the daily update as requested for C. Davee:  Residential referral application submitted on 11/19/21 to [facility] conducted on 11/19/21 with [facility] and [facility] regarding residential openings Follow up attempt made on 11/19/21 with [facility] regarding their residential openings Virtual meeting scheduled for 11/20/21 with providers Frances Furbish and Georgiana Shore regarding in-home services Group home tour with Frederich Chick scheduled for 11/21/21    Expected Discharge Plan:  (IFL- or home with adequate home services) Barriers to Discharge: Other (must enter comment) (placement)  Expected Discharge Plan and Services Expected Discharge Plan:  (IFL- or home with adequate home services)   Discharge Planning Services: CM Consult   Living arrangements for the past 2 months: Single Family Home                 DME Arranged: N/A DME Agency: NA                   Social Determinants of Health (SDOH) Interventions    Readmission Risk Interventions     No data to display

## 2021-11-20 DIAGNOSIS — Z741 Need for assistance with personal care: Secondary | ICD-10-CM | POA: Diagnosis not present

## 2021-11-20 NOTE — ED Notes (Addendum)
Pt sat up to eat breakfast. Pt refusing morning medications at this time. Stating "I don't need those medications." Pt soiled but requesting to finish breakfast prior to changing linens for the day.

## 2021-11-20 NOTE — ED Notes (Signed)
Pt caregiver with patient. Pt resting in bed, eating lunch. No assisted requested. Pt provided with more chucks, briefs, blankets and sheets.

## 2021-11-20 NOTE — ED Provider Notes (Signed)
-----------------------------------------   6:14 AM on 11/20/2021 -----------------------------------------   Blood pressure 122/66, pulse 71, temperature 98.2 F (36.8 C), temperature source Oral, resp. rate 16, height 1.854 m (6\' 1" ), weight 90.7 kg, SpO2 99 %.  The patient is calm and cooperative at this time.  There have been no acute events since the last update.  Awaiting disposition plan from [facility] team.   [facility], MD 11/20/21 [phone]

## 2021-11-20 NOTE — ED Notes (Signed)
Pt getting cleaned up by  caregiver- Med pass after completion

## 2021-11-21 DIAGNOSIS — Z741 Need for assistance with personal care: Secondary | ICD-10-CM | POA: Diagnosis not present

## 2021-11-21 NOTE — ED Notes (Signed)
Brief changed of urine, linens changed, pt's face neck and back washed, barrier cream applied, sacrum/perineum appears dry and intact, skin is warm, dry, pink. No edema noted, skin is elastic and non-tenting. Pt is AOX4, in NAD.

## 2021-11-21 NOTE — ED Notes (Signed)
Brief changed at this time. Pt cleaned up. New chuck pads and linens placed

## 2021-11-21 NOTE — ED Notes (Signed)
Pt given meal and water cup refilled. Pt's caregiver at bedside to give bedbath.

## 2021-11-21 NOTE — TOC Progression Note (Signed)
Transition of Care [facility]) - Progression Note    Patient Details  Name: Derrick Jordan MRN: [id number] Date of Birth: 01-09-1986  Transition of Care [facility]) CM/SW Contact  Allayne Butcher, RN Phone Number: 11/21/2021, 9:22 AM  Clinical Narrative:    Update for placement efforts from [facility]:  Residential referral application submitted on 11/20/21 to [facility] as they have 2 vacancies coming available that would accommodate member's needs CM Extender heard back from [facility] on 11/20/21 and they have 2 vacancies that would accommodate member's needs Virtual meeting conducted on 11/20/21 with providers Frances Furbish and Maxim regarding recruitment efforts for staffing in-home services and providers will follow up with CM on 11/24/21 regarding staff availability to cover service hours in the home Group home tour with Frederich Chick scheduled for 11/21/21     Expected Discharge Plan:  (IFL- or home with adequate home services) Barriers to Discharge: Other (must enter comment) (placement)  Expected Discharge Plan and Services Expected Discharge Plan:  (IFL- or home with adequate home services)   Discharge Planning Services: CM Consult   Living arrangements for the past 2 months: Single Family Home                 DME Arranged: N/A DME Agency: NA                   Social Determinants of Health (SDOH) Interventions    Readmission Risk Interventions     No data to display

## 2021-11-21 NOTE — ED Notes (Signed)
Pt cleaned of crumbs and given new blanket. Room tidied of lunch.

## 2021-11-22 DIAGNOSIS — Z741 Need for assistance with personal care: Secondary | ICD-10-CM | POA: Diagnosis not present

## 2021-11-22 NOTE — ED Notes (Signed)
Pt brief and linens changed- pt repositioned in bed to eat breakfast

## 2021-11-22 NOTE — ED Provider Notes (Signed)
-----------------------------------------   5:09 AM on 11/22/2021 -----------------------------------------   Blood pressure 109/66, pulse 63, temperature 98.2 F (36.8 C), temperature source Oral, resp. rate 16, height 1.854 m (6\' 1" ), weight 90.7 kg, SpO2 98 %.  The patient is calm and cooperative at this time.  There have been no acute events since the last update.  Awaiting disposition plan from [facility] team.   [facility], MD 11/22/21 [phone]

## 2021-11-22 NOTE — ED Notes (Signed)
Pts brief & gown changed by this RN & EDT (Hailey). Pt repositioned in the bed and provided a warm blanket.

## 2021-11-22 NOTE — ED Notes (Signed)
Pt resting in bed with eyes closed and even respirations 

## 2021-11-22 NOTE — ED Notes (Signed)
Pt provided a warm blanket and repositioned in the bed. Pts brief is dry - no other concerns at this time.

## 2021-11-22 NOTE — ED Notes (Signed)
Pt mother given pillow cases per her request.

## 2021-11-22 NOTE — ED Notes (Signed)
Pts checked and his brief dry, no other concerns at this time.

## 2021-11-23 ENCOUNTER — Encounter: Payer: Self-pay | Admitting: Emergency Medicine

## 2021-11-23 DIAGNOSIS — Z741 Need for assistance with personal care: Secondary | ICD-10-CM | POA: Diagnosis not present

## 2021-11-23 NOTE — ED Notes (Signed)
Patient incontinent of urine and stool. Bedside cleaning done and full linen change including gown. New brief placed on patient.

## 2021-11-23 NOTE — ED Provider Notes (Signed)
Today's Vitals   11/23/21 0251 11/23/21 0441 11/23/21 0559 11/23/21 0643  BP:      Pulse:      Resp:      Temp:      TempSrc:      SpO2:      Weight:      Height:      PainSc: 0-No pain Asleep Asleep Asleep   Body mass index is 26.39 kg/m.  No acute events overnight.  Awaiting social work disposition.   Raji Glinski, [name], DO 11/23/21 [phone]

## 2021-11-23 NOTE — ED Notes (Signed)
Patient repositioned sitting up with breakfast tray at this time. Care giver at bedside.

## 2021-11-24 DIAGNOSIS — Z741 Need for assistance with personal care: Secondary | ICD-10-CM | POA: Diagnosis not present

## 2021-11-24 NOTE — ED Notes (Signed)
Pt home aid gave bath.

## 2021-11-24 NOTE — ED Notes (Signed)
Tech provided by family at bedside feeding pt

## 2021-11-24 NOTE — ED Notes (Signed)
Pt given meal tray.

## 2021-11-24 NOTE — ED Notes (Signed)
Pt's aid at bedside and pt has been changed.

## 2021-11-24 NOTE — ED Provider Notes (Signed)
-----------------------------------------   5:37 AM on 11/24/2021 -----------------------------------------   Blood pressure (!) 118/54, pulse 82, temperature 98.2 F (36.8 C), temperature source Oral, resp. rate 18, height 6\' 1"  (1.854 m), weight 90.7 kg, SpO2 96 %.  The patient is calm and cooperative at this time.  There have been no acute events since the last update.  Awaiting disposition plan from Social Work team.   , MD 11/24/21 [phone]

## 2021-11-24 NOTE — ED Notes (Signed)
Pt tech provided with linens, gowns, sheets, towels, washcloths, wipes, briefs and pads. Also given condom caths.

## 2021-11-25 DIAGNOSIS — Z741 Need for assistance with personal care: Secondary | ICD-10-CM | POA: Diagnosis not present

## 2021-11-25 NOTE — ED Provider Notes (Signed)
Emergency Medicine Observation Re-evaluation Note  Derrick Jordan is a 36 y.o. male, seen on rounds today.  Pt initially presented to the ED for complaints of Urinary Retention Currently, the patient is resting calmly.  Physical Exam  BP 126/77   Pulse 78   Temp 98.7 F (37.1 C) (Oral)   Resp 16   Ht 6\' 1"  (1.854 m)   Wt 90.7 kg   SpO2 96%   BMI 26.39 kg/m  Physical Exam  ED Course / MDM  EKG:   I have reviewed the labs performed to date as well as medications administered while in observation.  Recent changes in the last 24 hours include none.  Plan  Current plan is for social work dispo.  [name] is not under involuntary commitment.     [name], MD 11/25/21 2234

## 2021-11-25 NOTE — TOC Progression Note (Signed)
Transition of Care [facility]) - Progression Note    Patient Details  Name: Derrick Jordan MRN: [id number] Date of Birth: 05-Oct-1985  Transition of Care [facility]) CM/SW Contact  Allayne Butcher, RN Phone Number: 11/25/2021, 2:36 PM  Clinical Narrative:    Received update from Laurena Bering that patient has been accepted to the [facility].  Mom now needs to fill out the housing application for HUD housing and for the comprehensive background check. Received a call just now that mom had not started working on the housing application.  RNCM called and spoke with mom and she reports that filling out the application is top priority for her tomorrow.    In the interim they are still trying to fully staff Frances Furbish and Maxim for patient to go home until the application approved.     Expected Discharge Plan:  (IFL- or home with adequate home services) Barriers to Discharge: Other (must enter comment) (placement)  Expected Discharge Plan and Services Expected Discharge Plan:  (IFL- or home with adequate home services)   Discharge Planning Services: CM Consult   Living arrangements for the past 2 months: Single Family Home                 DME Arranged: N/A DME Agency: NA                   Social Determinants of Health (SDOH) Interventions    Readmission Risk Interventions     No data to display

## 2021-11-25 NOTE — ED Provider Notes (Signed)
-----------------------------------------   4:11 AM on 11/25/2021 -----------------------------------------   Blood pressure 130/75, pulse 77, temperature 98.2 F (36.8 C), resp. rate 18, height 6\' 1"  (1.854 m), weight 90.7 kg, SpO2 98 %.  The patient is calm and cooperative at this time.  There have been no acute events since the last update.  Awaiting disposition plan from case management/social work.    Breklyn Fabrizio, , DO 11/25/21 [phone]

## 2021-11-26 DIAGNOSIS — Z741 Need for assistance with personal care: Secondary | ICD-10-CM | POA: Diagnosis not present

## 2021-11-26 NOTE — ED Notes (Signed)
Pt cleaned and dry at this time

## 2021-11-26 NOTE — ED Notes (Signed)
Breakfast tray placed at bedside, pt currently sleeping.

## 2021-11-26 NOTE — ED Notes (Signed)
Patient resting quietly in bed with eyes closed. Resp even, unlabored on RA. No distress noted at this time. 

## 2021-11-26 NOTE — ED Notes (Signed)
Pt dry and pulled up in bed. Fed pt breakfast after meds.

## 2021-11-26 NOTE — ED Notes (Signed)
Pt caregiver at bedside 

## 2021-11-26 NOTE — ED Notes (Signed)
Patient resting in bed, alert, watching TV. Resp even, unlabored on RA. No distress noted at this time.

## 2021-11-26 NOTE — ED Notes (Signed)
Pt given black coffee

## 2021-11-27 DIAGNOSIS — Z741 Need for assistance with personal care: Secondary | ICD-10-CM | POA: Diagnosis not present

## 2021-11-27 NOTE — ED Notes (Signed)
Pt watching tv

## 2021-11-27 NOTE — ED Notes (Signed)
Meds given  pt alert, watching tv.  No acute distress.

## 2021-11-27 NOTE — TOC Progression Note (Signed)
Transition of Care [facility]) - Progression Note    Patient Details  Name: Derrick Jordan MRN: [id number] Date of Birth: 1985-06-19  Transition of Care [facility]) CM/SW Contact  Allayne Butcher, RN Phone Number: 11/27/2021, 3:55 PM  Clinical Narrative:    Spoke with patient's mother, Ala Dach.  She is actively working on the Veterinary surgeon.  She needs to go to the [facility] [place] to get the patient's birth certificate and a PCP form from the patient's doctor filled out. She is working on these things this week.  Maudy not feeling well and is hoping she doesn't have COVID.   Expected Discharge Plan:  (IFL- or home with adequate home services) Barriers to Discharge: Other (must enter comment) (placement)  Expected Discharge Plan and Services Expected Discharge Plan:  (IFL- or home with adequate home services)   Discharge Planning Services: CM Consult   Living arrangements for the past 2 months: Single Family Home                 DME Arranged: N/A DME Agency: NA                   Social Determinants of Health (SDOH) Interventions    Readmission Risk Interventions     No data to display

## 2021-11-27 NOTE — ED Notes (Signed)
Resumed care from moriah rn.  Pt alert  caregiver with pt   meds given.

## 2021-11-27 NOTE — ED Provider Notes (Signed)
-----------------------------------------   6:59 AM on 11/27/2021 -----------------------------------------   Blood pressure 113/78, pulse 87, temperature 97.6 F (36.4 C), temperature source Oral, resp. rate 17, height 6\' 1"  (1.854 m), weight 90.7 kg, SpO2 100 %.  The patient is calm and cooperative at this time.  There have been no acute events since the last update.  Awaiting disposition plan from case management/social work.    Yussuf Sawyers, , DO 11/27/21 [phone]

## 2021-11-28 DIAGNOSIS — Z741 Need for assistance with personal care: Secondary | ICD-10-CM | POA: Diagnosis not present

## 2021-11-29 DIAGNOSIS — Z741 Need for assistance with personal care: Secondary | ICD-10-CM | POA: Diagnosis not present

## 2021-11-29 NOTE — ED Notes (Signed)
Pt given orange juice and coffee as requested. Tv turned on as requested.

## 2021-11-29 NOTE — ED Provider Notes (Signed)
-----------------------------------------   6:18 AM on 11/29/2021 -----------------------------------------   Blood pressure 133/78, pulse 74, temperature 97.7 F (36.5 C), resp. rate 16, height 1.854 m (6\' 1" ), weight 90.7 kg, SpO2 98 %.  The patient is calm and cooperative at this time.  There have been no acute events since the last update.  Awaiting disposition plan from [facility] team.   [facility], MD 11/29/21 [phone]

## 2021-11-30 DIAGNOSIS — Z741 Need for assistance with personal care: Secondary | ICD-10-CM | POA: Diagnosis not present

## 2021-11-30 NOTE — ED Notes (Signed)
Pt voided 850 ml in urinal , bed sheets changed , assisted pt with bannanna and water

## 2021-12-01 DIAGNOSIS — Z741 Need for assistance with personal care: Secondary | ICD-10-CM | POA: Diagnosis not present

## 2021-12-01 NOTE — TOC Progression Note (Signed)
Transition of Care [facility]) - Progression Note    Patient Details  Name: Derrick Jordan MRN: [id number] Date of Birth: 01/19/1986  Transition of Care [facility]) CM/SW Contact  Gildardo Griffes, [place] Phone Number: 12/01/2021, 4:18 PM  Clinical Narrative:     Updates per Colin Mulders with Laurena Bering email [email or web address])  "CM spoke with LRP on 12/01/21 and she is still working on the Veterinary surgeon for [employer] group home and hopes to have it completed by 12/03/21. LRP still needs to obtain member's birth certificate for the application and required signature from his physician. CM informed by [facility] management on 12/01/21 they have 1 staff ready to go and can provide 60 out of 84 hours of approved in-home service while member transitions to the group home. LRP, Frances Furbish and Georgiana Shore are continuing search to find other staff that can cover the additional hours."  Angeline Slim, [place] [phone]   Expected Discharge Plan:  (IFL- or home with adequate home services) Barriers to Discharge: Other (must enter comment) (placement)  Expected Discharge Plan and Services Expected Discharge Plan:  (IFL- or home with adequate home services)   Discharge Planning Services: CM Consult   Living arrangements for the past 2 months: Single Family Home                 DME Arranged: N/A DME Agency: NA                   Social Determinants of Health (SDOH) Interventions    Readmission Risk Interventions     No data to display

## 2021-12-01 NOTE — ED Notes (Signed)
Pt has caregivers and mother at bedside at this time providing patient care.

## 2021-12-01 NOTE — ED Notes (Signed)
Pt resting comfortably at this time. Pt alert.

## 2021-12-01 NOTE — TOC Progression Note (Addendum)
Transition of Care [facility]) - Progression Note    Patient Details  Name: Derrick Jordan MRN: [id number] Date of Birth: [birth year]  Transition of Care [facility]) CM/SW Contact  Gildardo Griffes, [place] Phone Number: 12/01/2021, 9:35 AM  Clinical Narrative:     Update: Colin Mulders called this CSW back, reports she will forward 4pm updates to this CSW via e-mail. Reports no new updates really as they continue to await completed paperwork from patient's mother for placement.   CSW lvm with Lucienne Minks with Laurena Bering at [phone] requesting updates. Pending call back at this time.   Expected Discharge Plan:  (IFL- or home with adequate home services) Barriers to Discharge: Other (must enter comment) (placement)  Expected Discharge Plan and Services Expected Discharge Plan:  (IFL- or home with adequate home services)   Discharge Planning Services: CM Consult   Living arrangements for the past 2 months: Single Family Home                 DME Arranged: N/A DME Agency: NA                   Social Determinants of Health (SDOH) Interventions    Readmission Risk Interventions     No data to display

## 2021-12-01 NOTE — ED Notes (Addendum)
Caregiver at the bedside assisting with the patients dinner tray. Pt provided gingerale and OJ, no other concerns at this time.

## 2021-12-01 NOTE — ED Notes (Signed)
Pt had removed condom catheter from penis. Pt cleaned and placed into brief at this time.

## 2021-12-02 DIAGNOSIS — Z741 Need for assistance with personal care: Secondary | ICD-10-CM | POA: Diagnosis not present

## 2021-12-02 NOTE — ED Notes (Addendum)
Pt checked brief dry condom cath in place - pt watching TV at this time.

## 2021-12-02 NOTE — ED Notes (Signed)
Meds given  pt watching tv.  

## 2021-12-02 NOTE — ED Notes (Addendum)
Pts brief checked condom cath in place- dry at this time. Pt asleep at this time - visualized chest rise and fall.

## 2021-12-02 NOTE — ED Notes (Signed)
Pt checked brief dry condom cath in place - bag emptied.

## 2021-12-02 NOTE — ED Notes (Signed)
Pt had large BM.  Pt cleaned and bedding changed.

## 2021-12-02 NOTE — TOC Progression Note (Signed)
Transition of Care [facility]) - Progression Note    Patient Details  Name: Derrick Jordan MRN: [id number] Date of Birth: Jun 02, 1985  Transition of Care [facility]) CM/SW Contact  Gildardo Griffes, [place] Phone Number: 12/02/2021, 4:14 PM  Clinical Narrative:     Per Lillia Mountain IDD Care Manager with [facility]:  No new updates for member C. Cassity other than LRP continuing to work on Veterinary surgeon and obtain birth certificate for admission to [facility] group home.  Expected Discharge Plan:  (IFL- or home with adequate home services) Barriers to Discharge: Other (must enter comment) (placement)  Expected Discharge Plan and Services Expected Discharge Plan:  (IFL- or home with adequate home services)   Discharge Planning Services: CM Consult   Living arrangements for the past 2 months: Single Family Home                 DME Arranged: N/A DME Agency: NA                   Social Determinants of Health (SDOH) Interventions    Readmission Risk Interventions     No data to display

## 2021-12-02 NOTE — ED Notes (Signed)
Meds given  pt alert, watching tv.  Caregiver with pt.

## 2021-12-02 NOTE — ED Notes (Signed)
Pt asleep at this time - visualized chest rise and fall. Pt checked brief dry at this time.

## 2021-12-03 DIAGNOSIS — Z741 Need for assistance with personal care: Secondary | ICD-10-CM | POA: Diagnosis not present

## 2021-12-03 NOTE — ED Notes (Signed)
Pt sleeping. 

## 2021-12-03 NOTE — ED Notes (Signed)
Patient sitting in hospital bed and eating breakfast at this time. No needs expressed to RN.

## 2021-12-03 NOTE — ED Notes (Signed)
Pt alert,   sitter with pt.

## 2021-12-03 NOTE — ED Notes (Signed)
Patient brief and chucks changed.  Patient comfortable.

## 2021-12-03 NOTE — ED Provider Notes (Signed)
-----------------------------------------   5:14 AM on 12/03/2021 -----------------------------------------   Blood pressure (!) 156/81, pulse 77, temperature 98.2 F (36.8 C), temperature source Oral, resp. rate 20, height 6\' 1"  (1.854 m), weight 91.2 kg, SpO2 97 %.  The patient is calm and cooperative at this time.  There have been no acute events since the last update.  Awaiting disposition plan from case management/social work.    Natanael Saladin, , DO 12/03/21 [phone]

## 2021-12-03 NOTE — ED Notes (Signed)
Patient resting comfortably in bed. Caregiver at bedside. No needs expressed to RN at this time.

## 2021-12-03 NOTE — ED Notes (Signed)
Resumed care from lindsay rn  pt alert  sitter with pt.

## 2021-12-03 NOTE — ED Notes (Signed)
Patient resting comfortably in hospital bed at this time. No needs expressed to RN. NAD noted. Respirations even and unlabored.

## 2021-12-03 NOTE — ED Notes (Signed)
Meds given  pt watching tv.  

## 2021-12-04 DIAGNOSIS — Z741 Need for assistance with personal care: Secondary | ICD-10-CM | POA: Diagnosis not present

## 2021-12-04 NOTE — ED Notes (Signed)
Patient brief changed with new pads placed under patient. Patient moved up in bed and set up to eat breakfast. Patient denies any further needs at this time

## 2021-12-05 DIAGNOSIS — Z741 Need for assistance with personal care: Secondary | ICD-10-CM | POA: Diagnosis not present

## 2021-12-05 NOTE — ED Notes (Signed)
Pt given breakfast tray and morning medications. Pt requested his light be turned off. No other request or complaints at this time

## 2021-12-05 NOTE — TOC Progression Note (Signed)
Transition of Care [facility]) - Progression Note    Patient Details  Name: Pranav Lince MRN: [id number] Date of Birth: November 17, 1985  Transition of Care [facility]) CM/SW Contact  Allayne Butcher, RN Phone Number: 12/05/2021, 1:20 PM  Clinical Narrative:     RNCM spoke with patient's mother, Maudy.  She reports that she is almost finished with the [employer] application, she got the birth certificate yesterday, she will pick up the physician's form early next week from the PCP office in [place].  She reports that she is having a meeting with prospective staff for home tomorrow afternoon, she hopes to be fully staffed and be able for patient to go home next week, she reports that they need a hospital bed but not the mattress.  RNCM explained that when the date for DC is set hospital bed will be ordered but it can't be delivered more than 24 hours before DC.       Expected Discharge Plan:  (IFL- or home with adequate home services) Barriers to Discharge: Other (must enter comment) (placement)  Expected Discharge Plan and Services Expected Discharge Plan:  (IFL- or home with adequate home services)   Discharge Planning Services: CM Consult   Living arrangements for the past 2 months: Single Family Home                 DME Arranged: N/A DME Agency: NA                   Social Determinants of Health (SDOH) Interventions    Readmission Risk Interventions     No data to display

## 2021-12-05 NOTE — ED Notes (Signed)
Mother lets staff know that she is leaving for the night. Beverage at bedside. Warm blankets provided

## 2021-12-05 NOTE — ED Notes (Signed)
Mother called to have dinner held until she got here  tied up in traffic informed RN

## 2021-12-05 NOTE — ED Provider Notes (Signed)
-----------------------------------------   6:10 AM on 12/05/2021 -----------------------------------------   Blood pressure 131/88, pulse 89, temperature 98.2 F (36.8 C), temperature source Oral, resp. rate 15, height 6\' 1"  (1.854 m), weight 91.2 kg, SpO2 98 %.  The patient is calm and cooperative at this time.  There have been no acute events since the last update.  Awaiting disposition plan from case management/social work.    Hasson Gaspard, , DO 12/05/21 [phone]

## 2021-12-06 DIAGNOSIS — Z741 Need for assistance with personal care: Secondary | ICD-10-CM | POA: Diagnosis not present

## 2021-12-06 NOTE — ED Provider Notes (Signed)
-----------------------------------------   7:18 AM on 12/06/2021 -----------------------------------------   Blood pressure 119/82, pulse 86, temperature 98 F (36.7 C), temperature source Axillary, resp. rate 16, height 6\' 1"  (1.854 m), weight 91.2 kg, SpO2 97 %.  The patient is calm and cooperative at this time.  There have been no acute events since the last update.  Awaiting disposition plan from Social Work team.   , MD 12/06/21 [phone]

## 2021-12-06 NOTE — ED Notes (Signed)
Pt given lunch tray, home aid in the room assisting pt

## 2021-12-06 NOTE — ED Provider Notes (Signed)
   BP 121/74   Pulse 88   Temp 98.4 F (36.9 C) (Oral)   Resp 17   Ht 6\' 1"  (1.854 m)   Wt 91.2 kg   SpO2 98%   BMI 26.53 kg/m    No changes in last 24 hrs. Pt awaiting SW dispo.    , MD 12/06/21 [phone]

## 2021-12-06 NOTE — ED Notes (Signed)
Pt home aid in the room giving pt a bath and changing pt. Pt aid provided with linens, gowns, towels, wash cloths, pads and briefs and warm blankets

## 2021-12-07 DIAGNOSIS — Z741 Need for assistance with personal care: Secondary | ICD-10-CM | POA: Diagnosis not present

## 2021-12-07 NOTE — ED Notes (Signed)
Pt's room restocked with linens, briefs, and chucks. Pt's mother in room assisting pt with dinner.

## 2021-12-07 NOTE — ED Notes (Signed)
Personal staff in room with pt, pt using urinal with minimal assist. Pt eating with set up and minimal assist, pt eating 100% of meals. Pt alert, no c/o pain. Pt watching TV and conversing with staff.

## 2021-12-07 NOTE — ED Provider Notes (Signed)
-----------------------------------------   4:29 AM on 12/07/2021 -----------------------------------------   Blood pressure 123/76, pulse 73, temperature 98.2 F (36.8 C), resp. rate 18, height 6\' 1"  (1.854 m), weight 91.2 kg, SpO2 98 %.  The patient is calm and cooperative at this time.  There have been no acute events since the last update.  Awaiting disposition plan from case management/social work.    Yaslyn Cumby, , DO 12/07/21 [phone]

## 2021-12-07 NOTE — ED Notes (Signed)
Pt brief changed. Light turned out for pt to continue sleeping per request

## 2021-12-07 NOTE — ED Notes (Signed)
Pt asking for urinal, pt using urinal with success with minimal assist. Pt given coffee per request,no c/o pain or discomfort.

## 2021-12-08 DIAGNOSIS — Z741 Need for assistance with personal care: Secondary | ICD-10-CM | POA: Diagnosis not present

## 2021-12-08 NOTE — ED Notes (Signed)
Pt given breakfast tray

## 2021-12-08 NOTE — ED Provider Notes (Signed)
-----------------------------------------   6:08 AM on 12/08/2021 -----------------------------------------   Blood pressure 125/73, pulse 78, temperature 97.8 F (36.6 C), temperature source Oral, resp. rate 18, height 6\' 1"  (1.854 m), weight 91.2 kg, SpO2 97 %.  The patient is calm and cooperative at this time.  There have been no acute events since the last update.  Awaiting disposition plan from social work team.   , MD 12/08/21 [phone]

## 2021-12-08 NOTE — ED Notes (Signed)
Pt caregiver at bedside 

## 2021-12-08 NOTE — ED Notes (Signed)
Pt requesting 10 more mins of sleep prior to medication administration.

## 2021-12-09 DIAGNOSIS — Z741 Need for assistance with personal care: Secondary | ICD-10-CM | POA: Diagnosis not present

## 2021-12-09 NOTE — ED Provider Notes (Signed)
  BP (!) 139/97   Pulse 89   Temp 98.1 F (36.7 C) (Oral)   Resp 20   Ht 6\' 1"  (1.854 m)   Wt 91.2 kg   SpO2 97%   BMI 26.53 kg/m   Pt sleeping on my evaluation. Awaiting SW disposition.    , MD 12/09/21 [phone]

## 2021-12-09 NOTE — ED Notes (Signed)
Pt brief changed as well as all padding under him. Perineal care performed and new blankets provided. Pt refused a sheet, he only wanted blankets on him and also refused to have a gown on.

## 2021-12-09 NOTE — ED Notes (Signed)
This RN rounded on the patient - caregiver at the bedside changing the patient - declined assistance at this time. Provided additional towels and hygiene supplies. Callbell within reach.

## 2021-12-09 NOTE — ED Notes (Signed)
Pt checked - brief dry - repositioned. No other concerns at this time.

## 2021-12-09 NOTE — TOC Progression Note (Signed)
Transition of Care [facility]) - Progression Note    Patient Details  Name: Derrick Jordan MRN: [id number] Date of Birth: 1985/07/02  Transition of Care [facility]) CM/SW Contact  Allayne Butcher, RN Phone Number: 12/09/2021, 5:55 PM  Clinical Narrative:    Patient's mother is still working on the Veterinary surgeon for [employer].  RNCM reached out to [employer] with [place] [facility], patient is still on her case load, she will call mother and try to assist with her getting this application completed.  Frances Furbish has served notice for 30 day discharge.  Georgiana Shore is still trying to get people hired to staff patient at home.   Expected Discharge Plan:  (IFL- or home with adequate home services) Barriers to Discharge: Other (must enter comment) (placement)  Expected Discharge Plan and Services Expected Discharge Plan:  (IFL- or home with adequate home services)   Discharge Planning Services: CM Consult   Living arrangements for the past 2 months: Single Family Home                 DME Arranged: N/A DME Agency: NA                   Social Determinants of Health (SDOH) Interventions    Readmission Risk Interventions     No data to display

## 2021-12-09 NOTE — ED Notes (Signed)
This RN rounded on the patient - pt resting comfortably in bed, visualized chest rise and fall. No concerns at this time. Brief dry at this time.  

## 2021-12-09 NOTE — ED Notes (Signed)
Home health aide at bedside with patient

## 2021-12-09 NOTE — ED Notes (Addendum)
This RN rounded on the patient - pt resting comfortably in bed, visualized chest rise and fall. No concerns at this time. Brief dry at this time.

## 2021-12-09 NOTE — ED Notes (Signed)
Pt eating breakfast tray, given more orange juice and vs taken at this time. Pt has no complaints and is resting quietly in bed.

## 2021-12-10 DIAGNOSIS — Z741 Need for assistance with personal care: Secondary | ICD-10-CM | POA: Diagnosis not present

## 2021-12-10 NOTE — ED Notes (Signed)
Pts brief & blanket changed - no other concerns at this time.

## 2021-12-10 NOTE — ED Notes (Signed)
Patient repositioned sitting up with breakfast tray. Brief clean and dry.

## 2021-12-10 NOTE — ED Notes (Signed)
Pt provided with lunch tray. Home aids and mother in room assisting with feeding

## 2021-12-10 NOTE — ED Notes (Signed)
Provided pt home aid with towels, wash cloths, sheets, warm blankets, pillow cases, and pads

## 2021-12-10 NOTE — ED Provider Notes (Signed)
-----------------------------------------   6:39 AM on 12/10/2021 -----------------------------------------   Blood pressure 118/77, pulse 80, temperature 98 F (36.7 C), temperature source Oral, resp. rate 16, height 1.854 m (6\' 1" ), weight 91.2 kg, SpO2 99 %.  The patient is calm and cooperative when awake, currently asleep.  There have been no acute events since the last update.  Awaiting disposition plan from [facility] team.   [facility], MD 12/10/21 [phone]

## 2021-12-11 DIAGNOSIS — Z741 Need for assistance with personal care: Secondary | ICD-10-CM | POA: Diagnosis not present

## 2021-12-11 NOTE — ED Notes (Signed)
Pt given meal tray. Assisted pt was cutting food and sitting upright in the bed. Pt with no complaints at this time.

## 2021-12-11 NOTE — ED Notes (Signed)
Pt provided lunch tray. Caregiver at bedside.

## 2021-12-11 NOTE — ED Notes (Signed)
Pts brief, gown, and linen changed at this time. No other concerns at this time.

## 2021-12-11 NOTE — ED Notes (Signed)
Caregiver at bedside. No needs or concerns. She will be leaving soon.

## 2021-12-11 NOTE — ED Notes (Signed)
Decaf coffee given. PT watching TV. No signs of distress or needs expressed.

## 2021-12-11 NOTE — TOC Progression Note (Signed)
Transition of Care [facility]) - Progression Note    Patient Details  Name: Derrick Jordan MRN: [id number] Date of Birth: 09-03-1985  Transition of Care [facility]) CM/SW Contact  Allayne Butcher, RN Phone Number: 12/11/2021, 12:20 PM  Clinical Narrative:    Virtual meeting with Levonne Lapping, Georgiana Shore, myself and patient's mother completed this morning.  Patient's mother Ala Dach, did complete the HUD application and turned it in to the [place] at the [facility] facility, they will begin processing the application, Gearldine Bienenstock does not think that it will be long since patient has always resided in [place].   Maudy would like to take Jaconita home first before he goes to the home, she wants to take him to his doctor's appointments and make sure he is medically good before he goes to [employer].  Frederich Chick reports that they can take patient to all his appointments and they can make his appointments and mother can always meet them at the appointments.  Maudy still insists on taking patient home first.  Plan for discharge home is Monday.  They are fully staffed with Frances Furbish and [employer] except for 5 hours on Sundays, [place] believes that she can take care of the patient for that amount of time.  They are still trying to get a couple of other workers hired.  Maudy requests a hospital bed, she does not need the mattress, and a specialized shower chair that provides trunk support.  RNCM will reach out to [employer] to order equipment.   Ala Dach has a dental procedure on Monday so will need to follow up with her to schedule time of discharge.     Expected Discharge Plan:  (IFL- or home with adequate home services) Barriers to Discharge: Other (must enter comment) (placement)  Expected Discharge Plan and Services Expected Discharge Plan:  (IFL- or home with adequate home services)   Discharge Planning Services: CM Consult   Living arrangements for the past 2 months: Single Family Home                 DME Arranged: N/A DME  Agency: NA                   Social Determinants of Health (SDOH) Interventions    Readmission Risk Interventions     No data to display

## 2021-12-11 NOTE — ED Notes (Signed)
Pt resting in bed watching tv; brief dry at this time.

## 2021-12-11 NOTE — ED Provider Notes (Signed)
  4:28 AM   Blood pressure 108/74, pulse 75, temperature 97.7 F (36.5 C), temperature source Axillary, resp. rate 16, height 6\' 1"  (1.854 m), weight 91.2 kg, SpO2 97 %.   See their HPI for full report but in brief    The patient is calm and cooperative at this time.  There have been no acute events since the last update.  Awaiting disposition plan from [facility] team.    [facility], MD 12/11/21 [phone]

## 2021-12-11 NOTE — ED Notes (Signed)
OJ given. No other needs expressed.

## 2021-12-12 DIAGNOSIS — Z741 Need for assistance with personal care: Secondary | ICD-10-CM | POA: Diagnosis not present

## 2021-12-12 NOTE — ED Notes (Signed)
Pt resting with no signs of distress.

## 2021-12-12 NOTE — ED Notes (Signed)
Pt changed out of wet brief. Fresh sheets and gown.

## 2021-12-12 NOTE — ED Provider Notes (Signed)
-----------------------------------------   6:23 AM on 12/12/2021 -----------------------------------------   Blood pressure 118/75, pulse 88, temperature 98 F (36.7 C), temperature source Oral, resp. rate 18, height 1.854 m (6\' 1" ), weight 91.2 kg, SpO2 93 %.  The patient is calm and cooperative at this time.  There have been no acute events since the last update.  Awaiting disposition plan from [facility] team.   [facility], MD 12/12/21 [phone]

## 2021-12-12 NOTE — ED Notes (Signed)
Home health aid at bedside. This RN has been caring for other critical pt. Will give AM meds and get VS when possible.

## 2021-12-12 NOTE — ED Notes (Signed)
Pt changed. Brief wet. Warm blankets given per pt's request.

## 2021-12-12 NOTE — ED Notes (Signed)
Pt resting and watching TV. Denies any needs at this time.

## 2021-12-13 DIAGNOSIS — Z741 Need for assistance with personal care: Secondary | ICD-10-CM | POA: Diagnosis not present

## 2021-12-13 NOTE — ED Notes (Signed)
Patient pulled up in bed and set up with breakfast tray. Assisted patient in eating breakfast

## 2021-12-13 NOTE — ED Notes (Signed)
Pt resting at this time. Respiration even and unlabored.

## 2021-12-13 NOTE — ED Provider Notes (Signed)
-----------------------------------------   5:37 AM on 12/13/2021 -----------------------------------------   Blood pressure (!) 104/58, pulse 90, temperature 98.2 F (36.8 C), temperature source Oral, resp. rate 18, height 6\' 1"  (1.854 m), weight 91.2 kg, SpO2 97 %.  The patient is calm and cooperative at this time.  There have been no acute events since the last update.  Awaiting disposition plan from social work team.   , MD 12/13/21 [phone]

## 2021-12-13 NOTE — ED Notes (Addendum)
Pt resting in bed at this time. Denies any needs.

## 2021-12-13 NOTE — ED Notes (Signed)
Pt resting at this time. Respirations even and unlabored.

## 2021-12-13 NOTE — ED Notes (Signed)
Pt mother given clean sheets, towels, and gowns per her request.

## 2021-12-13 NOTE — ED Notes (Signed)
Pt able to urinate in urinal with assistance at this time.

## 2021-12-13 NOTE — ED Notes (Signed)
Report received, this RN now assuming care.  

## 2021-12-14 DIAGNOSIS — Z741 Need for assistance with personal care: Secondary | ICD-10-CM | POA: Diagnosis not present

## 2021-12-14 NOTE — ED Notes (Signed)
Pt mother took wheel chair home.

## 2021-12-14 NOTE — ED Notes (Signed)
Home aid gave pt a bath and changed linens.

## 2021-12-14 NOTE — ED Notes (Signed)
Home aid fed pt lunch.

## 2021-12-15 DIAGNOSIS — Z741 Need for assistance with personal care: Secondary | ICD-10-CM | POA: Diagnosis not present

## 2021-12-15 NOTE — TOC Progression Note (Signed)
Transition of Care [facility]) - Progression Note    Patient Details  Name: Derrick Jordan MRN: [id number] Date of Birth: 1985/12/07  Transition of Care [facility]) CM/SW Contact  Allayne Butcher, RN Phone Number: 12/15/2021, 9:10 AM  Clinical Narrative:    Reached out to [employer] to see when the hospital bed would be delivered, Zack with [employer] says he will try to have them deliver this morning.  Spoke with patient's mother and she reports that she should be home after 1:30 because of a dental procedure at [facility].  Asked mother to let me know when she was home and the bed was there so I can arrange EMS transport.     Expected Discharge Plan:  (IFL- or home with adequate home services) Barriers to Discharge: Other (must enter comment) (placement)  Expected Discharge Plan and Services Expected Discharge Plan:  (IFL- or home with adequate home services)   Discharge Planning Services: CM Consult   Living arrangements for the past 2 months: Single Family Home                 DME Arranged: N/A DME Agency: NA                   Social Determinants of Health (SDOH) Interventions    Readmission Risk Interventions     No data to display

## 2021-12-15 NOTE — Progress Notes (Signed)
    Durable Medical Equipment  (From admission, onward)           Start     Ordered   12/11/21 1235  For home use only DME Other see comment  Once       Comments: Rehab Shower/Commode chair-tilt, recline Patient has TBI, paraplegia, muscle spasticity and needs trunk support  Question:  Length of Need  Answer:  12 Months   12/11/21 1237   12/11/21 1232  For home use only DME Hospital bed  Once       Comments: Patient already has a mattress just need the bed itself  Question Answer Comment  Length of Need 12 Months   Patient has (list medical condition): Traumatic brain injury, paraplegia, muscle spasticity   The above medical condition requires: Patient requires the ability to reposition frequently   Head must be elevated greater than: 30 degrees   Bed type Semi-electric   Support Surface: Gel Overlay      12/11/21 1237

## 2021-12-15 NOTE — ED Provider Notes (Signed)
Have personally assessed the patient at this time and he has no complaints.  Reviewed most recent vital signs and most recent laboratory tests, and have deemed this patient appropriate for discharge back to his home.  I reviewed TOC notes other medical notes regarding plan of care and agree patient is safe for discharge at this time.Marland Kitchen  5:54 PM Pilar Jarvis MD   Pilar Jarvis, MD 12/15/21 1754

## 2021-12-15 NOTE — ED Notes (Signed)
PT brief dry, bedding unsoiled. Pt given breakfast tray. Pt denies any needs at this time.

## 2021-12-15 NOTE — ED Provider Notes (Signed)
-----------------------------------------   6:08 AM on 12/15/2021 -----------------------------------------   Blood pressure 112/74, pulse 84, temperature 98.2 F (36.8 C), temperature source Oral, resp. rate 17, height 6\' 1"  (1.854 m), weight 91.2 kg, SpO2 99 %.  The patient is calm and cooperative at this time.  There have been no acute events since the last update.  Awaiting disposition plan from Social Work team.   , MD 12/15/21 [phone]

## 2021-12-15 NOTE — ED Notes (Signed)
Pt cleansed of incontinence. Full linen change completed.

## 2021-12-15 NOTE — ED Notes (Signed)
Pt aid given linens, gowns, towels, wash cloths, pads and briefs

## 2021-12-15 NOTE — ED Notes (Signed)
Pt aid cleaning out room and taking all belongings home. Pt mother gave verbal consent for pt to be DC

## 2021-12-15 NOTE — TOC Transition Note (Signed)
Transition of Care [facility]) - CM/SW Discharge Note   Patient Details  Name: Derrick Jordan MRN: [id number] Date of Birth: 1985-08-15  Transition of Care [facility]) CM/SW Contact:  Allayne Butcher, RN Phone Number: 12/15/2021, 2:25 PM   Clinical Narrative:    Patient discharging from the emergency department today, going home with mother and home care PCS services from [place] and [place].  Hospital bed is being set up at this time at the home by [employer].  Mother is home and ready to receive patient.  ED secretary will be calling for EMS transport.     Final next level of care: Home w Home Health Services Barriers to Discharge: Barriers Resolved   Patient Goals and CMS Choice Patient states their goals for this hospitalization and ongoing recovery are:: Mother would like for patient to get the services he needs at home so he can stay at home but in the mean time she is agreeable to IFL- will work with Laurena Bering and [employer].gov Compare Post Acute Care list provided to:: Patient Represenative (must comment) Choice offered to / list presented to : [facility] POA / Guardian, Parent  Discharge Placement                Patient to be transferred to facility by: Transferred to Home by EMS Name of family member notified: Ala Dach- Mother Patient and family notified of of transfer: 12/15/21  Discharge Plan and Services   Discharge Planning Services: CM Consult            DME Arranged: Hospital bed DME Agency: [employer] Date DME Agency Contacted: 12/11/21 Time DME Agency Contacted: 1425 Representative spoke with at DME Agency: Oletha Cruel HH Arranged: RN HH Agency: [facility], [employer] Services Date [facility] Agency Contacted: 12/15/21 Time HH Agency Contacted: 1425    Social Determinants of Health (SDOH) Interventions     Readmission Risk Interventions     No data to display

## 2022-06-29 ENCOUNTER — Emergency Department: Payer: Medicare PPO

## 2022-06-29 ENCOUNTER — Other Ambulatory Visit: Payer: Self-pay

## 2022-06-29 ENCOUNTER — Emergency Department
Admission: EM | Admit: 2022-06-29 | Discharge: 2022-06-30 | Disposition: A | Discharge status: Home / Self Care | Payer: Medicare PPO | Attending: Emergency Medicine | Admitting: Emergency Medicine

## 2022-06-29 DIAGNOSIS — R339 Retention of urine, unspecified: Secondary | ICD-10-CM | POA: Diagnosis not present

## 2022-06-29 DIAGNOSIS — K5900 Constipation, unspecified: Secondary | ICD-10-CM

## 2022-06-29 LAB — CBC WITH DIFFERENTIAL/PLATELET
Abs Immature Granulocytes: 0.02 10*3/uL (ref 0.00–0.07)
Basophils Absolute: 0.1 10*3/uL (ref 0.0–0.1)
Basophils Relative: 1 %
Eosinophils Absolute: 0.1 10*3/uL (ref 0.0–0.5)
Eosinophils Relative: 2 %
HCT: 43.7 % (ref 39.0–52.0)
Hemoglobin: 14.1 g/dL (ref 13.0–17.0)
Immature Granulocytes: 0 %
Lymphocytes Relative: 29 %
Lymphs Abs: 1.8 10*3/uL (ref 0.7–4.0)
MCH: 28.1 pg (ref 26.0–34.0)
MCHC: 32.3 g/dL (ref 30.0–36.0)
MCV: 87.2 fL (ref 80.0–100.0)
Monocytes Absolute: 0.5 10*3/uL (ref 0.1–1.0)
Monocytes Relative: 9 %
Neutro Abs: 3.6 10*3/uL (ref 1.7–7.7)
Neutrophils Relative %: 59 %
Platelets: 270 10*3/uL (ref 150–400)
RBC: 5.01 MIL/uL (ref 4.22–5.81)
RDW: 12.7 % (ref 11.5–15.5)
WBC: 6 10*3/uL (ref 4.0–10.5)
nRBC: 0 % (ref 0.0–0.2)

## 2022-06-29 LAB — URINALYSIS, ROUTINE W REFLEX MICROSCOPIC
Bilirubin Urine: NEGATIVE
Glucose, UA: NEGATIVE mg/dL
Hgb urine dipstick: NEGATIVE
Ketones, ur: NEGATIVE mg/dL
Leukocytes,Ua: NEGATIVE
Nitrite: NEGATIVE
Protein, ur: NEGATIVE mg/dL
Specific Gravity, Urine: 1.002 — ABNORMAL LOW (ref 1.005–1.030)
pH: 7 (ref 5.0–8.0)

## 2022-06-29 LAB — COMPREHENSIVE METABOLIC PANEL
ALT: 28 U/L (ref 0–44)
AST: 25 U/L (ref 15–41)
Albumin: 4.3 g/dL (ref 3.5–5.0)
Alkaline Phosphatase: 60 U/L (ref 38–126)
Anion gap: 8 (ref 5–15)
BUN: 6 mg/dL (ref 6–20)
CO2: 26 mmol/L (ref 22–32)
Calcium: 9 mg/dL (ref 8.9–10.3)
Chloride: 103 mmol/L (ref 98–111)
Creatinine, Ser: 0.56 mg/dL — ABNORMAL LOW (ref 0.61–1.24)
GFR, Estimated: >60 mL/min (ref >60)
Glucose, Bld: 85 mg/dL (ref 70–99)
Potassium: 3.5 mmol/L (ref 3.5–5.1)
Sodium: 137 mmol/L (ref 135–145)
Total Bilirubin: 0.6 mg/dL (ref 0.3–1.2)
Total Protein: 7.5 g/dL (ref 6.5–8.1)

## 2022-06-29 MED ORDER — MAGNESIUM HYDROXIDE 400 MG/5ML PO SUSP: 30.0000 mL | Freq: Every day | ORAL | Status: DC | PRN

## 2022-06-29 MED ORDER — TRAZODONE HCL 50 MG PO TABS
50.0000 mg | ORAL_TABLET | Freq: Every day | ORAL | Status: DC
  Administered 2022-06-29: 50 mg via ORAL
  Filled 2022-06-29: qty 1

## 2022-06-29 MED ORDER — IPRATROPIUM BROMIDE 0.06 % NA SOLN: 2.0000 | Freq: Three times a day (TID) | NASAL | Status: DC

## 2022-06-29 MED ORDER — CLONIDINE HCL 0.1 MG PO TABS
0.1000 mg | ORAL_TABLET | Freq: Every day | ORAL | Status: DC
  Filled 2022-06-29: qty 1

## 2022-06-29 MED ORDER — TAMSULOSIN HCL 0.4 MG PO CAPS
0.4000 mg | ORAL_CAPSULE | Freq: Every day | ORAL | Status: DC
  Administered 2022-06-29 – 2022-06-30 (×2): 0.4 mg via ORAL
  Filled 2022-06-29 (×2): qty 1

## 2022-06-29 MED ORDER — PRUCALOPRIDE SUCCINATE 2 MG PO TABS: 2.0000 mg | ORAL_TABLET | Freq: Every day | ORAL | Status: DC

## 2022-06-29 MED ORDER — LINACLOTIDE 145 MCG PO CAPS
145.0000 ug | ORAL_CAPSULE | Freq: Every day | ORAL | Status: DC
  Administered 2022-06-30: 145 ug via ORAL
  Filled 2022-06-29: qty 1

## 2022-06-29 MED ORDER — HYDROXYZINE HCL 25 MG PO TABS: 25.0000 mg | ORAL_TABLET | Freq: Two times a day (BID) | ORAL | Status: DC | PRN

## 2022-06-29 MED ORDER — BUSPIRONE HCL 5 MG PO TABS
5.0000 mg | ORAL_TABLET | Freq: Three times a day (TID) | ORAL | Status: DC
  Administered 2022-06-29 – 2022-06-30 (×2): 5 mg via ORAL
  Filled 2022-06-29 (×2): qty 1

## 2022-06-29 MED ORDER — KETOCONAZOLE 2 % EX SHAM: 1.0000 | MEDICATED_SHAMPOO | CUTANEOUS | Status: DC

## 2022-06-29 MED ORDER — KETOCONAZOLE 2 % EX CREA: 1.0000 | TOPICAL_CREAM | Freq: Every day | CUTANEOUS | Status: DC

## 2022-06-29 MED ORDER — MINERAL OIL RE ENEM: 1.0000 | ENEMA | Freq: Once | RECTAL | Status: DC | PRN

## 2022-06-29 MED ORDER — AMMONIUM LACTATE 12 % EX LOTN: 1.0000 | TOPICAL_LOTION | Freq: Two times a day (BID) | CUTANEOUS | Status: DC

## 2022-06-29 MED ORDER — GABAPENTIN 100 MG PO CAPS
100.0000 mg | ORAL_CAPSULE | Freq: Three times a day (TID) | ORAL | Status: DC
  Administered 2022-06-29 – 2022-06-30 (×2): 100 mg via ORAL
  Filled 2022-06-29 (×2): qty 1

## 2022-06-29 MED ORDER — POLYETHYLENE GLYCOL 3350 17 G PO PACK: PACK | ORAL | 0 refills | Status: AC

## 2022-06-29 MED ORDER — PANTOPRAZOLE SODIUM 40 MG PO TBEC
40.0000 mg | DELAYED_RELEASE_TABLET | Freq: Every day | ORAL | Status: DC
  Administered 2022-06-29 – 2022-06-30 (×2): 40 mg via ORAL
  Filled 2022-06-29 (×2): qty 1

## 2022-06-29 MED ORDER — MODAFINIL 100 MG PO TABS
50.0000 mg | ORAL_TABLET | Freq: Every morning | ORAL | Status: DC
  Administered 2022-06-30: 50 mg via ORAL
  Filled 2022-06-29: qty 1

## 2022-06-29 MED ORDER — DOCUSATE SODIUM 250 MG PO CAPS: 250.0000 mg | ORAL_CAPSULE | Freq: Every day | ORAL | 0 refills | Status: AC

## 2022-06-29 MED ORDER — CALCIUM CARBONATE 1250 (500 CA) MG PO TABS
1.0000 | ORAL_TABLET | Freq: Every day | ORAL | Status: DC
  Administered 2022-06-30: 1250 mg via ORAL
  Filled 2022-06-29: qty 1

## 2022-06-29 MED ORDER — LAMOTRIGINE 100 MG PO TABS
200.0000 mg | ORAL_TABLET | Freq: Every morning | ORAL | Status: DC
  Administered 2022-06-30: 200 mg via ORAL
  Filled 2022-06-29: qty 2

## 2022-06-29 MED ORDER — PROPRANOLOL HCL 20 MG PO TABS
10.0000 mg | ORAL_TABLET | Freq: Two times a day (BID) | ORAL | Status: DC
  Filled 2022-06-29 (×2): qty 1

## 2022-06-29 MED ORDER — BISACODYL 10 MG RE SUPP: 10.0000 mg | Freq: Every day | RECTAL | Status: DC | PRN

## 2022-06-29 MED ORDER — VITAMIN D 25 MCG (1000 UNIT) PO TABS
1000.0000 [IU] | ORAL_TABLET | Freq: Every day | ORAL | Status: DC
  Administered 2022-06-29 – 2022-06-30 (×2): 1000 [IU] via ORAL
  Filled 2022-06-29 (×2): qty 1

## 2022-06-29 MED ORDER — IBANDRONATE SODIUM 150 MG PO TABS: 150.0000 mg | ORAL_TABLET | ORAL | Status: DC

## 2022-06-29 MED ORDER — SODIUM FLUORIDE 1.1 % DT CREA: 1.0000 | TOPICAL_CREAM | Freq: Two times a day (BID) | DENTAL | Status: DC

## 2022-06-29 MED ORDER — DIAZEPAM 2 MG PO TABS
2.0000 mg | ORAL_TABLET | Freq: Every day | ORAL | Status: DC
  Administered 2022-06-29: 2 mg via ORAL
  Filled 2022-06-29: qty 1

## 2022-06-29 MED ORDER — CLINDAMYCIN PHOSPHATE 1 % EX LOTN: 1.0000 "application " | TOPICAL_LOTION | CUTANEOUS | Status: DC

## 2022-06-29 MED ORDER — CICLOPIROX OLAMINE 0.77 % EX CREA: 1.0000 "application " | TOPICAL_CREAM | Freq: Two times a day (BID) | CUTANEOUS | Status: DC

## 2022-06-29 MED ORDER — TACROLIMUS 0.1 % EX OINT: 1.0000 | TOPICAL_OINTMENT | Freq: Two times a day (BID) | CUTANEOUS | Status: DC

## 2022-06-29 MED ORDER — ACETAMINOPHEN 325 MG PO TABS
650.0000 mg | ORAL_TABLET | Freq: Three times a day (TID) | ORAL | Status: DC | PRN
Start: 2022-06-29 — End: 2022-06-30

## 2022-06-29 MED ORDER — ADULT MULTIVITAMIN W/MINERALS CH
1.0000 | ORAL_TABLET | Freq: Every day | ORAL | Status: DC
  Administered 2022-06-29 – 2022-06-30 (×2): 1 via ORAL
  Filled 2022-06-29 (×2): qty 1

## 2022-06-29 MED ORDER — CITALOPRAM HYDROBROMIDE 20 MG PO TABS
20.0000 mg | ORAL_TABLET | Freq: Two times a day (BID) | ORAL | Status: DC
  Administered 2022-06-29 – 2022-06-30 (×2): 20 mg via ORAL
  Filled 2022-06-29 (×2): qty 1

## 2022-06-29 MED ORDER — POLYETHYLENE GLYCOL 3350 17 G PO PACK
17.0000 g | PACK | Freq: Two times a day (BID) | ORAL | Status: DC
  Administered 2022-06-30 (×2): 17 g via ORAL
  Filled 2022-06-29 (×2): qty 1

## 2022-06-29 MED ORDER — RIVAROXABAN 10 MG PO TABS
10.0000 mg | ORAL_TABLET | Freq: Every day | ORAL | Status: DC
  Administered 2022-06-29: 10 mg via ORAL
  Filled 2022-06-29 (×3): qty 1

## 2022-06-29 MED ORDER — CHLORHEXIDINE GLUCONATE 0.12 % MT SOLN: 15.0000 mL | Freq: Every day | OROMUCOSAL | Status: DC

## 2022-06-29 NOTE — ED Notes (Signed)
Bladder scan volume 182m.

## 2022-06-29 NOTE — ED Notes (Signed)
Pt mom called to verify pt has not received any home medications today.

## 2022-06-29 NOTE — ED Notes (Signed)
Pt moved onto an inpatient bed, given boxed lunch, and OJ.

## 2022-06-29 NOTE — Discharge Instructions (Addendum)
Derrick Jordan's urine output looked normal here and his CT can showed no signs of obstruction or retention.  He does have significant constipation which I suspect is contributing to his long periods of time between pees followed by large amount of urine.  For his constipation, take the bowel regimen prescribed and try to keep bowel movements regular.  For urine output, I'd recommend monitoring urine output over 24 hour periods as long as he is urinating at least once every 12 hours or so. If he has no abdominal discomfort and is still urinating some, this is generally okay. His urine output does not need to match his fluid intake exactly.  Another thing that can help with urine output beyond treating constipation is changing positions. Sitting him upright or in different positions often will relieve bladder pressure.

## 2022-06-29 NOTE — ED Triage Notes (Addendum)
Pt arrived from home via EMS for urinary retention. Per EMS patient has periods of time where he barely has output. Pt denies any pain, N/V/D. Pt does have a condom catheter in place.

## 2022-06-29 NOTE — ED Provider Notes (Signed)
[facility] Provider Note    None    (approximate)   History   Urinary Retention   HPI  Derrick Jordan is a 37 y.o. male  here with urinary retention. History provided by EMS report from mom. Pt reportedly has been having increased issue with urinating recently. He did pass >1L of urine prior to arrival and states he feels better. This has been more frequent though. No recent med changes. No other complaints. Pt denies any complaints on my assessments and states he is here to get labs. No other complaints. Remainder of history limited 2/2 mental status. Mother is en route.       Physical Exam   Triage Vital Signs: ED Triage Vitals  Enc Vitals Group     BP      Pulse      Resp      Temp      Temp src      SpO2      Weight      Height      Head Circumference      Peak Flow      Pain Score      Pain Loc      Pain Edu?      Excl. in [place]?     Most recent vital signs: Vitals:   06/29/22 1510 06/29/22 1515  BP:  123/88  Pulse:  73  Resp:  18  Temp: 98 F (36.7 C)   SpO2:  100%     General: Awake, no distress.  CV:  Good peripheral perfusion. RRR.  Resp:  Normal work of breathing. Lungs clear to auscultation bilaterally. Abd:  No distention. No tenderness. No rebound or guarding. Other:  No LE edema.   ED Results / Procedures / Treatments   Labs (all labs ordered are listed, but only abnormal results are displayed) Labs Reviewed  COMPREHENSIVE METABOLIC PANEL - Abnormal; Notable for the following components:      Result Value   Creatinine, Ser 0.56 (*)    All other components within normal limits  URINALYSIS, ROUTINE W REFLEX MICROSCOPIC - Abnormal; Notable for the following components:   Color, Urine STRAW (*)    APPearance CLEAR (*)    Specific Gravity, Urine 1.002 (*)    All other components within normal limits  CBC WITH DIFFERENTIAL/PLATELET     EKG    RADIOLOGY CT stone: No evidence of obstruction,  moderate constipation noted   I also independently reviewed and agree with radiologist interpretations.   PROCEDURES:  Critical Care performed: No   MEDICATIONS ORDERED IN ED: Medications  acetaminophen (TYLENOL) tablet 650 mg (has no administration in time range)  ammonium lactate (AMLACTIN) 12 % cream 1 Application (has no administration in time range)  bisacodyl (DULCOLAX) suppository 10 mg (has no administration in time range)  busPIRone (BUSPAR) tablet 5-10 mg (5 mg Oral Given 06/29/22 2223)  calcium carbonate (OS-CAL - dosed in mg of elemental calcium) tablet 1,250 mg (has no administration in time range)  chlorhexidine (PERIDEX) 0.12 % solution 15 mL (has no administration in time range)  cholecalciferol (VITAMIN D3) 25 MCG (1000 UNIT) tablet 1,000 Units (1,000 Units Oral Given 06/29/22 2221)  ciclopirox (LOPROX) 123XX123 % cream 1 application  (1 application  Topical Not Given 06/29/22 2233)  citalopram (CELEXA) tablet 20 mg (20 mg Oral Given 06/29/22 2220)  clindamycin (CLEOCIN T) 1 % lotion 1 application  (has no administration in time range)  cloNIDine (  CATAPRES) tablet 0.1 mg (has no administration in time range)  sodium fluoride (PREVIDENT 5000 PLUS) 1.1 % dental cream 1 Application (has no administration in time range)  diazepam (VALIUM) tablet 2 mg (2 mg Oral Given 06/29/22 2219)  gabapentin (NEURONTIN) capsule 100-200 mg (has no administration in time range)  hydrOXYzine (ATARAX) 10 MG/5ML syrup 25 mg (has no administration in time range)  ibandronate (BONIVA) tablet 150 mg (has no administration in time range)  ipratropium (ATROVENT) 0.06 % nasal spray 2 spray (has no administration in time range)  ketoconazole (NIZORAL) 2 % cream 1 Application (has no administration in time range)  ketoconazole (NIZORAL) 2 % shampoo 1 Application (has no administration in time range)  lamoTRIgine (LAMICTAL) tablet 200 mg (has no administration in time range)  linaclotide (LINZESS) capsule  145 mcg (has no administration in time range)  mineral oil enema 1 enema (has no administration in time range)  magnesium hydroxide (MILK OF MAGNESIA) suspension 30 mL (has no administration in time range)  modafinil (PROVIGIL) tablet 50 mg (has no administration in time range)  Prucalopride Succinate TABS 2 mg (has no administration in time range)  multivitamin with minerals tablet 1 tablet (1 tablet Oral Given 06/29/22 2220)  pantoprazole (PROTONIX) EC tablet 40 mg (40 mg Oral Given 06/29/22 2221)  propranolol (INDERAL) tablet 10 mg (has no administration in time range)  tacrolimus (PROTOPIC) 0.1 % ointment 1 Application (has no administration in time range)  tamsulosin (FLOMAX) capsule 0.4 mg (0.4 mg Oral Given 06/29/22 2219)  traZODone (DESYREL) tablet 50 mg (50 mg Oral Given 06/29/22 2223)  rivaroxaban (XARELTO) tablet 10 mg (has no administration in time range)  polyethylene glycol (MIRALAX / GLYCOLAX) packet 17 g (has no administration in time range)     IMPRESSION / MDM / ASSESSMENT AND PLAN / ED COURSE  I reviewed the triage vital signs and the nursing notes.                              Differential diagnosis includes, but is not limited to, urinary retention 2/2 medications, immobility, constipation, stone, fecal impaction, AKI/dehydration  Patient's presentation is most consistent with acute presentation with potential threat to life or bodily function.  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 Pt has no retention here and has voided spontaneously without difficulty. He does have moderate constipation on CT and I suspect this could be contributing to his relative retention according to mother - she states he goes 8-10 hours between urination episodes then voids "a lot." He has no signs of AKI or retention. Labs  reassuring. UA Negative. CT obtained and shows no obstruction as mentioned.  Will increase his bowel regimen to see if this helps and encourage continued monitoring of outpuit. Otherwise no emergent medical pathology.  When we called mother to discuss and plan d/c, mother now stating that home health "is no longer there" and that she cannot take care of pt. She states they told her they were going to stop coming. Will ask TOC to evaluate, as pt was just here for a prolonged stay and should have appropriate care at home already arranged.   FINAL CLINICAL IMPRESSION(S) / ED DIAGNOSES   Final diagnoses:  Constipation, unspecified constipation type     Rx / DC  Orders   ED Discharge Orders          Ordered    docusate sodium (COLACE) 250 MG capsule  Daily        06/29/22 1950    polyethylene glycol (MIRALAX) 17 g packet        06/29/22 1950             Note:  This document was prepared using Dragon voice recognition software and may include unintentional dictation errors.   Duffy Bruce, MD 06/29/22 [phone]

## 2022-06-29 NOTE — ED Notes (Signed)
Pt given orange juice, denies other needs at this time

## 2022-06-29 NOTE — ED Notes (Signed)
Voicemail left with pt mom, regarding plan to discharge pt.

## 2022-06-30 DIAGNOSIS — K5900 Constipation, unspecified: Secondary | ICD-10-CM | POA: Diagnosis not present

## 2022-06-30 NOTE — Progress Notes (Signed)

## 2022-06-30 NOTE — ED Provider Notes (Signed)
Procedures     ----------------------------------------- 1:15 PM on 06/30/2022 -----------------------------------------  No acute needs today.  Vitals are normal.  Mom here to pick patient up.    Carrie Mew, MD 06/30/22 1315

## 2022-06-30 NOTE — ED Notes (Signed)
Pt sleeping at this time, VSS.

## 2022-06-30 NOTE — ED Notes (Signed)
Vital signs stable. Pt sleeping

## 2022-06-30 NOTE — Progress Notes (Addendum)
CSW spoke with patient's mother Jodelle Green who reports she is on the way to pick patient up, requested that patient's hoyer lift and blue sling be available. She reports she will bring his wheelchair as they will be brining wheelchair van to pick up. Treatment team made aware.   Kelby Fam, [place], MSW, [place]

## 2022-08-17 ENCOUNTER — Other Ambulatory Visit: Payer: Self-pay

## 2022-08-17 ENCOUNTER — Emergency Department
Admission: EM | Admit: 2022-08-17 | Discharge: 2022-08-19 | Disposition: A | Discharge status: Home / Self Care | Payer: Medicare PPO | Attending: Emergency Medicine | Admitting: Emergency Medicine

## 2022-08-17 DIAGNOSIS — Z87820 Personal history of traumatic brain injury: Secondary | ICD-10-CM | POA: Diagnosis not present

## 2022-08-17 DIAGNOSIS — Z764 Other boarder to healthcare facility: Secondary | ICD-10-CM | POA: Diagnosis present

## 2022-08-17 LAB — CBC WITH DIFFERENTIAL/PLATELET
Abs Immature Granulocytes: 0.02 10*3/uL (ref 0.00–0.07)
Basophils Absolute: 0.1 10*3/uL (ref 0.0–0.1)
Basophils Relative: 1 %
Eosinophils Absolute: 0.2 10*3/uL (ref 0.0–0.5)
Eosinophils Relative: 3 %
HCT: 44.7 % (ref 39.0–52.0)
Hemoglobin: 14.4 g/dL (ref 13.0–17.0)
Immature Granulocytes: 0 %
Lymphocytes Relative: 34 %
Lymphs Abs: 2.2 10*3/uL (ref 0.7–4.0)
MCH: 28.3 pg (ref 26.0–34.0)
MCHC: 32.2 g/dL (ref 30.0–36.0)
MCV: 87.8 fL (ref 80.0–100.0)
Monocytes Absolute: 0.5 10*3/uL (ref 0.1–1.0)
Monocytes Relative: 7 %
Neutro Abs: 3.5 10*3/uL (ref 1.7–7.7)
Neutrophils Relative %: 55 %
Platelets: 268 10*3/uL (ref 150–400)
RBC: 5.09 MIL/uL (ref 4.22–5.81)
RDW: 12.9 % (ref 11.5–15.5)
WBC: 6.3 10*3/uL (ref 4.0–10.5)
nRBC: 0 % (ref 0.0–0.2)

## 2022-08-17 LAB — URINALYSIS, W/ REFLEX TO CULTURE (INFECTION SUSPECTED)
Bilirubin Urine: NEGATIVE
Glucose, UA: NEGATIVE mg/dL
Hgb urine dipstick: NEGATIVE
Ketones, ur: NEGATIVE mg/dL
Leukocytes,Ua: NEGATIVE
Nitrite: NEGATIVE
Protein, ur: NEGATIVE mg/dL
Specific Gravity, Urine: 1.004 — ABNORMAL LOW (ref 1.005–1.030)
Squamous Epithelial / HPF: NONE SEEN /HPF (ref 0–5)
pH: 8 (ref 5.0–8.0)

## 2022-08-17 LAB — COMPREHENSIVE METABOLIC PANEL
ALT: 37 U/L (ref 0–44)
AST: 27 U/L (ref 15–41)
Albumin: 4.4 g/dL (ref 3.5–5.0)
Alkaline Phosphatase: 55 U/L (ref 38–126)
Anion gap: 7 (ref 5–15)
BUN: 5 mg/dL — ABNORMAL LOW (ref 6–20)
CO2: 28 mmol/L (ref 22–32)
Calcium: 8.9 mg/dL (ref 8.9–10.3)
Chloride: 104 mmol/L (ref 98–111)
Creatinine, Ser: 0.53 mg/dL — ABNORMAL LOW (ref 0.61–1.24)
GFR, Estimated: >60 mL/min (ref >60)
Glucose, Bld: 95 mg/dL (ref 70–99)
Potassium: 3.8 mmol/L (ref 3.5–5.1)
Sodium: 139 mmol/L (ref 135–145)
Total Bilirubin: 0.7 mg/dL (ref 0.3–1.2)
Total Protein: 7.5 g/dL (ref 6.5–8.1)

## 2022-08-17 MED ORDER — MAGNESIUM HYDROXIDE 400 MG/5ML PO SUSP: 5.0000 mL | Freq: Every day | ORAL | Status: DC | PRN

## 2022-08-17 MED ORDER — CLONIDINE HCL 0.1 MG PO TABS
0.1000 mg | ORAL_TABLET | Freq: Every day | ORAL | Status: DC
  Administered 2022-08-17 – 2022-08-18 (×2): 0.1 mg via ORAL
  Filled 2022-08-17 (×2): qty 1

## 2022-08-17 MED ORDER — DIAZEPAM 2 MG PO TABS
2.0000 mg | ORAL_TABLET | Freq: Every day | ORAL | Status: DC
  Administered 2022-08-17 – 2022-08-18 (×2): 2 mg via ORAL
  Filled 2022-08-17 (×2): qty 1

## 2022-08-17 MED ORDER — MODAFINIL 100 MG PO TABS
50.0000 mg | ORAL_TABLET | Freq: Every morning | ORAL | Status: DC
  Administered 2022-08-18 – 2022-08-19 (×2): 50 mg via ORAL
  Filled 2022-08-17 (×2): qty 1

## 2022-08-17 MED ORDER — MINERAL OIL RE ENEM: 1.0000 | ENEMA | Freq: Once | RECTAL | Status: DC | PRN

## 2022-08-17 MED ORDER — SENNA 8.6 MG PO TABS
2.0000 | ORAL_TABLET | Freq: Two times a day (BID) | ORAL | Status: DC
  Administered 2022-08-17 – 2022-08-19 (×3): 17.2 mg via ORAL
  Filled 2022-08-17 (×4): qty 2

## 2022-08-17 MED ORDER — GABAPENTIN 100 MG PO CAPS
100.0000 mg | ORAL_CAPSULE | Freq: Three times a day (TID) | ORAL | Status: DC
  Administered 2022-08-17 – 2022-08-18 (×2): 100 mg via ORAL
  Filled 2022-08-17 (×2): qty 1

## 2022-08-17 MED ORDER — LINACLOTIDE 145 MCG PO CAPS
145.0000 ug | ORAL_CAPSULE | Freq: Every day | ORAL | Status: DC
  Administered 2022-08-18 – 2022-08-19 (×2): 145 ug via ORAL
  Filled 2022-08-17 (×2): qty 1

## 2022-08-17 MED ORDER — CALCIUM CARBONATE ANTACID 500 MG PO CHEW
3.0000 | CHEWABLE_TABLET | Freq: Every day | ORAL | Status: DC
  Administered 2022-08-18 – 2022-08-19 (×2): 600 mg via ORAL
  Filled 2022-08-17 (×2): qty 3

## 2022-08-17 MED ORDER — LORATADINE 10 MG PO TABS
10.0000 mg | ORAL_TABLET | Freq: Every day | ORAL | Status: DC
  Administered 2022-08-17 – 2022-08-19 (×3): 10 mg via ORAL
  Filled 2022-08-17 (×3): qty 1

## 2022-08-17 MED ORDER — BISACODYL 10 MG RE SUPP: 10.0000 mg | Freq: Every day | RECTAL | Status: DC | PRN

## 2022-08-17 MED ORDER — CHLORHEXIDINE GLUCONATE 0.12 % MT SOLN
15.0000 mL | Freq: Every day | OROMUCOSAL | Status: DC
  Administered 2022-08-18 – 2022-08-19 (×2): 15 mL via OROMUCOSAL

## 2022-08-17 MED ORDER — ACETAMINOPHEN 325 MG PO TABS: 650.0000 mg | ORAL_TABLET | Freq: Three times a day (TID) | ORAL | Status: DC | PRN

## 2022-08-17 MED ORDER — VITAMIN D 25 MCG (1000 UNIT) PO TABS
1000.0000 [IU] | ORAL_TABLET | Freq: Every day | ORAL | Status: DC
  Administered 2022-08-17 – 2022-08-19 (×3): 1000 [IU] via ORAL
  Filled 2022-08-17 (×3): qty 1

## 2022-08-17 MED ORDER — LAMOTRIGINE 100 MG PO TABS: 200.0000 mg | ORAL_TABLET | Freq: Every day | ORAL | Status: DC

## 2022-08-17 MED ORDER — IBANDRONATE SODIUM 150 MG PO TABS: 150.0000 mg | ORAL_TABLET | ORAL | Status: DC

## 2022-08-17 MED ORDER — KETOCONAZOLE 2 % EX SHAM
1.0000 | MEDICATED_SHAMPOO | CUTANEOUS | Status: DC
  Administered 2022-08-18: 1 via TOPICAL
  Filled 2022-08-17: qty 120

## 2022-08-17 MED ORDER — TRAZODONE HCL 50 MG PO TABS
50.0000 mg | ORAL_TABLET | Freq: Every day | ORAL | Status: DC
  Administered 2022-08-17: 50 mg via ORAL
  Administered 2022-08-18: 25 mg via ORAL
  Filled 2022-08-17 (×2): qty 1

## 2022-08-17 MED ORDER — PANTOPRAZOLE SODIUM 40 MG PO TBEC
40.0000 mg | DELAYED_RELEASE_TABLET | Freq: Every day | ORAL | Status: DC
  Administered 2022-08-18 – 2022-08-19 (×2): 40 mg via ORAL
  Filled 2022-08-17 (×2): qty 1

## 2022-08-17 MED ORDER — IPRATROPIUM BROMIDE 0.06 % NA SOLN
2.0000 | Freq: Three times a day (TID) | NASAL | Status: DC
  Administered 2022-08-18 (×2): 2 via NASAL
  Filled 2022-08-17: qty 15

## 2022-08-17 MED ORDER — BUSPIRONE HCL 5 MG PO TABS
5.0000 mg | ORAL_TABLET | Freq: Three times a day (TID) | ORAL | Status: DC
  Administered 2022-08-17 – 2022-08-18 (×2): 5 mg via ORAL
  Filled 2022-08-17 (×2): qty 1

## 2022-08-17 MED ORDER — PROPRANOLOL HCL 20 MG PO TABS
10.0000 mg | ORAL_TABLET | Freq: Two times a day (BID) | ORAL | Status: DC
  Administered 2022-08-17 – 2022-08-19 (×4): 10 mg via ORAL
  Filled 2022-08-17 (×4): qty 1

## 2022-08-17 MED ORDER — TACROLIMUS 0.1 % EX OINT: 1.0000 | TOPICAL_OINTMENT | Freq: Two times a day (BID) | CUTANEOUS | Status: DC

## 2022-08-17 MED ORDER — AMMONIUM LACTATE 12 % EX LOTN
1.0000 | TOPICAL_LOTION | Freq: Two times a day (BID) | CUTANEOUS | Status: DC
  Administered 2022-08-18 – 2022-08-19 (×3): 1 via TOPICAL
  Filled 2022-08-17: qty 400

## 2022-08-17 MED ORDER — TAMSULOSIN HCL 0.4 MG PO CAPS
0.4000 mg | ORAL_CAPSULE | Freq: Every day | ORAL | Status: DC
  Administered 2022-08-17 – 2022-08-19 (×3): 0.4 mg via ORAL
  Filled 2022-08-17 (×3): qty 1

## 2022-08-17 MED ORDER — ADULT MULTIVITAMIN W/MINERALS CH
1.0000 | ORAL_TABLET | Freq: Every day | ORAL | Status: DC
  Administered 2022-08-18 – 2022-08-19 (×2): 1 via ORAL
  Filled 2022-08-17 (×2): qty 1

## 2022-08-17 MED ORDER — HYDROXYZINE HCL 25 MG PO TABS: 25.0000 mg | ORAL_TABLET | Freq: Two times a day (BID) | ORAL | Status: DC | PRN

## 2022-08-17 MED ORDER — PRUCALOPRIDE SUCCINATE 2 MG PO TABS: 2.0000 mg | ORAL_TABLET | Freq: Every day | ORAL | Status: DC

## 2022-08-17 MED ORDER — RIVAROXABAN 10 MG PO TABS
10.0000 mg | ORAL_TABLET | Freq: Every day | ORAL | Status: DC
  Administered 2022-08-18 – 2022-08-19 (×2): 10 mg via ORAL
  Filled 2022-08-17 (×2): qty 1

## 2022-08-17 MED ORDER — CITALOPRAM HYDROBROMIDE 20 MG PO TABS
20.0000 mg | ORAL_TABLET | Freq: Two times a day (BID) | ORAL | Status: DC
  Administered 2022-08-17 – 2022-08-19 (×4): 20 mg via ORAL
  Filled 2022-08-17 (×4): qty 1

## 2022-08-17 MED ORDER — KETOCONAZOLE 2 % EX CREA
1.0000 | TOPICAL_CREAM | Freq: Every day | CUTANEOUS | Status: DC
  Administered 2022-08-18: 1 via TOPICAL
  Filled 2022-08-17: qty 15

## 2022-08-17 NOTE — ED Notes (Addendum)
Consulting civil engineer received a call from Sandrea Hughs with DSS. Per Toniann Fail, They took out ex parte order on pt, DSS is going to court on Friday for guardianship hearing. Per Toniann Fail they sent pt here to have "an assessment" to make sure mother has not missed anything. Toniann Fail states that DSS is going to start looking for placement after guardianship hearing. Diona Fanti will be the case worker, she is out of the office today but will be back tomorrow. Per Toniann Fail, mother is not allowed to make any decisions for patient and she asked that no medical information be released to the patients mother. Per Toniann Fail mother is allowed to visit.    Sandrea Hughs: [phone]  Diona Fanti: [phone]

## 2022-08-17 NOTE — ED Notes (Signed)
Pt had large loose BM. Pt cleaned up and clean linens and brief placed on pt. Pt placed in clean gown. Pt given warm blankets and pulled up in bed.

## 2022-08-17 NOTE — ED Triage Notes (Signed)
First RN note-  Police called out per DSS, as pt's mother is kicking out patients home care nurse. Due to this DSS took out protective papers and patient brought to the ED.   Past history of TBI, A&O x2-3

## 2022-08-17 NOTE — ED Notes (Signed)
Pt provided cup of coffee, TV turned on per pt's request.
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
## 2022-08-17 NOTE — ED Notes (Signed)
Pt's mother at bedside with sitter supervision. Pt's mother brought 2 bags of belongings that she states have things in them hat he needs; including special splints, "hard to find medications" and "non-alcoholic beer". All belongings are kept at nurse's station and will be sent home with mother.

## 2022-08-17 NOTE — ED Notes (Signed)
Pt brief linens changed from urine incontinence with this RN and Sam RN

## 2022-08-17 NOTE — ED Triage Notes (Signed)
See first nurse note. DSS took pt from mothers care. Pt denies complaints.

## 2022-08-18 DIAGNOSIS — Z764 Other boarder to healthcare facility: Secondary | ICD-10-CM | POA: Diagnosis not present

## 2022-08-18 MED ORDER — LAMOTRIGINE 25 MG PO TABS: 150.0000 mg | ORAL_TABLET | Freq: Every day | ORAL | Status: DC

## 2022-08-18 MED ORDER — GABAPENTIN 100 MG PO CAPS
200.0000 mg | ORAL_CAPSULE | Freq: Every day | ORAL | Status: DC
  Administered 2022-08-18: 200 mg via ORAL
  Filled 2022-08-18: qty 2

## 2022-08-18 MED ORDER — LAMOTRIGINE 100 MG PO TABS: 200.0000 mg | ORAL_TABLET | Freq: Every day | ORAL | Status: DC

## 2022-08-18 MED ORDER — TACROLIMUS 0.1 % EX OINT
1.0000 | TOPICAL_OINTMENT | Freq: Two times a day (BID) | CUTANEOUS | Status: DC
  Administered 2022-08-18: 1 via TOPICAL
  Filled 2022-08-18: qty 1

## 2022-08-18 MED ORDER — LAMOTRIGINE 100 MG PO TABS: 100.0000 mg | ORAL_TABLET | Freq: Every day | ORAL | Status: DC

## 2022-08-18 MED ORDER — PRUCALOPRIDE SUCCINATE 2 MG PO TABS
2.0000 mg | ORAL_TABLET | Freq: Every day | ORAL | Status: DC
  Administered 2022-08-19: 2 mg via ORAL
  Filled 2022-08-18: qty 1

## 2022-08-18 MED ORDER — LAMOTRIGINE 25 MG PO TABS
50.0000 mg | ORAL_TABLET | Freq: Every day | ORAL | Status: DC
  Filled 2022-08-18: qty 2

## 2022-08-18 MED ORDER — LAMOTRIGINE 100 MG PO TABS
200.0000 mg | ORAL_TABLET | Freq: Every day | ORAL | Status: DC
  Administered 2022-08-19: 200 mg via ORAL
  Filled 2022-08-18: qty 2

## 2022-08-18 MED ORDER — BUSPIRONE HCL 5 MG PO TABS
10.0000 mg | ORAL_TABLET | Freq: Every day | ORAL | Status: DC
  Administered 2022-08-18: 10 mg via ORAL
  Filled 2022-08-18: qty 2

## 2022-08-18 MED ORDER — BUSPIRONE HCL 5 MG PO TABS
5.0000 mg | ORAL_TABLET | ORAL | Status: DC
  Administered 2022-08-18 – 2022-08-19 (×3): 5 mg via ORAL
  Filled 2022-08-18 (×3): qty 1

## 2022-08-18 MED ORDER — GABAPENTIN 100 MG PO CAPS
100.0000 mg | ORAL_CAPSULE | Freq: Two times a day (BID) | ORAL | Status: DC
  Administered 2022-08-18 – 2022-08-19 (×2): 100 mg via ORAL
  Filled 2022-08-18 (×2): qty 1

## 2022-08-18 NOTE — ED Notes (Signed)
Patient's mother is in ED visiting with son with ED tech supervision. Patient's mother informed of this.

## 2022-08-18 NOTE — ED Notes (Signed)
Mother provided Prucalopride for patient's chronic constipation from home since we do not have this medication in the pharmacy. This RN taking the medication down to the pharmacy.

## 2022-08-18 NOTE — TOC Progression Note (Signed)
Transition of Care [facility]) - Progression Note    Patient Details  Name: Derrick Jordan MRN: [id number] Date of Birth: [birth year]  [facility]) CM/SW Contact  Halford Chessman Phone Number: 08/18/2022, 4:51 PM  Clinical Narrative:     CSW received a phone call from [place].  They have a potential placement for patient at an Alternative Family Living home (AFL).  Laurena Bering will update TOC tomorrow with name and address of potential placement.  Patient may be able to discharge tomorrow, TOC to continue following patient's progress throughout discharge planning.   Expected Discharge Plan: Group Home Barriers to Discharge: Unsafe home situation, ED Claims of Negligence on the Part of Facility/Family, ED Facility/Family Refusing to Allow Patient to Return, ED Unsafe disposition  Expected Discharge Plan and Services     Post Acute Care Choice:  (Alternative Family Care Home)                                         Social Determinants of Health (SDOH) Interventions    Readmission Risk Interventions     No data to display

## 2022-08-18 NOTE — ED Provider Notes (Signed)
Emergency Medicine Observation Re-evaluation Note  Derrick Jordan is a 37 y.o. male, seen on rounds today.  Pt initially presented to the ED for complaints of Placement   Physical Exam  BP 124/76 (BP Location: Right Arm)   Pulse 71   Temp 98.6 F (37 C) (Oral)   Resp 16   Wt 98.4 kg   SpO2 98%   BMI 28.63 kg/m  Physical Exam General: NAD  ED Course / MDM  EKG:   I have reviewed the labs performed to date as well as medications administered while in observation.   Plan  Current plan is for placement.    Willy Eddy, MD 08/18/22 [phone]

## 2022-08-18 NOTE — ED Notes (Signed)
This RN and Eilene Ghazi, NT provided peri-care, wiped down patient's bed with purple wipes due to urine saturation, applied clean bed linens, applied new chux pads, changed patient's brief, and changed patient gown. Patient resting comfortably.

## 2022-08-18 NOTE — ED Notes (Signed)
Patient's mother still visiting with patient at the bedside with NT supervision.

## 2022-08-19 DIAGNOSIS — Z764 Other boarder to healthcare facility: Secondary | ICD-10-CM | POA: Diagnosis not present

## 2022-08-19 MED ORDER — DIAZEPAM 2 MG PO TABS: 2.0000 mg | ORAL_TABLET | Freq: Every day | ORAL | 0 refills | Status: AC

## 2022-08-19 MED ORDER — FEXOFENADINE HCL 180 MG PO TABS: 180.0000 mg | ORAL_TABLET | Freq: Every day | ORAL | 1 refills | Status: AC

## 2022-08-19 MED ORDER — FLEET PEDIATRIC 3.5-9.5 GM/59ML RE ENEM: 1.0000 | ENEMA | Freq: Every day | RECTAL | 0 refills | Status: AC | PRN

## 2022-08-19 MED ORDER — PROPRANOLOL HCL 10 MG PO TABS: 10.0000 mg | ORAL_TABLET | Freq: Two times a day (BID) | ORAL | 0 refills | Status: AC

## 2022-08-19 MED ORDER — CLINDAMYCIN PHOSPHATE 1 % EX LOTN: 1.0000 "application " | TOPICAL_LOTION | Freq: Two times a day (BID) | CUTANEOUS | 1 refills | Status: AC

## 2022-08-19 MED ORDER — TAMSULOSIN HCL 0.4 MG PO CAPS: 0.4000 mg | ORAL_CAPSULE | Freq: Every day | ORAL | 0 refills | Status: AC

## 2022-08-19 MED ORDER — AMMONIUM LACTATE 12 % EX CREA: 1.0000 | TOPICAL_CREAM | Freq: Two times a day (BID) | CUTANEOUS | 0 refills | Status: AC

## 2022-08-19 MED ORDER — OMEPRAZOLE MAGNESIUM 20 MG PO TBEC: 20.0000 mg | DELAYED_RELEASE_TABLET | Freq: Every day | ORAL | 0 refills | Status: AC

## 2022-08-19 MED ORDER — VITAMIN D 25 MCG (1000 UNIT) PO TABS: 1000.0000 [IU] | ORAL_TABLET | Freq: Every day | ORAL | 0 refills | Status: AC

## 2022-08-19 MED ORDER — KETOCONAZOLE 2 % EX CREA: 1.0000 | TOPICAL_CREAM | Freq: Every day | CUTANEOUS | 0 refills | Status: AC

## 2022-08-19 MED ORDER — XARELTO 10 MG PO TABS: 10.0000 mg | ORAL_TABLET | Freq: Every day | ORAL | 0 refills | Status: AC

## 2022-08-19 MED ORDER — CLONIDINE HCL 0.1 MG PO TABS: 0.1000 mg | ORAL_TABLET | Freq: Every day | ORAL | 0 refills | Status: AC

## 2022-08-19 MED ORDER — IBANDRONATE SODIUM 150 MG PO TABS: 150.0000 mg | ORAL_TABLET | ORAL | 1 refills | Status: AC

## 2022-08-19 MED ORDER — CHLORHEXIDINE GLUCONATE 0.12 % MT SOLN: 15.0000 mL | Freq: Every day | OROMUCOSAL | 0 refills | Status: AC

## 2022-08-19 MED ORDER — CARBOXYMETHYLCELLULOSE SOD PF 1 % OP GEL: 1.0000 [drp] | Freq: Every day | OPHTHALMIC | 1 refills | Status: AC

## 2022-08-19 MED ORDER — LAMOTRIGINE 200 MG PO TABS: 200.0000 mg | ORAL_TABLET | Freq: Every day | ORAL | 0 refills | Status: AC

## 2022-08-19 MED ORDER — TRAZODONE HCL 50 MG PO TABS: 50.0000 mg | ORAL_TABLET | Freq: Every day | ORAL | 0 refills | Status: AC

## 2022-08-19 MED ORDER — BISACODYL 10 MG RE SUPP: 10.0000 mg | Freq: Every day | RECTAL | 0 refills | Status: AC | PRN

## 2022-08-19 MED ORDER — ARIPIPRAZOLE 2 MG PO TABS: 2.0000 mg | ORAL_TABLET | Freq: Every day | ORAL | 0 refills | Status: AC

## 2022-08-19 MED ORDER — CITALOPRAM HYDROBROMIDE 20 MG PO TABS: 20.0000 mg | ORAL_TABLET | Freq: Two times a day (BID) | ORAL | 1 refills | Status: AC

## 2022-08-19 MED ORDER — MUPIROCIN 2 % EX OINT: 1.0000 | TOPICAL_OINTMENT | Freq: Every day | CUTANEOUS | 0 refills | Status: AC

## 2022-08-19 MED ORDER — CICLOPIROX OLAMINE 0.77 % EX CREA: 1.0000 "application " | TOPICAL_CREAM | Freq: Two times a day (BID) | CUTANEOUS | 1 refills | Status: AC

## 2022-08-19 MED ORDER — MELATONIN 3 MG PO TABS: 3.0000 mg | ORAL_TABLET | Freq: Every day | ORAL | 11 refills | Status: AC

## 2022-08-19 MED ORDER — IPRATROPIUM BROMIDE 0.06 % NA SOLN: 2.0000 | Freq: Three times a day (TID) | NASAL | 1 refills | Status: AC

## 2022-08-19 MED ORDER — ONDANSETRON 4 MG PO TBDP: 4.0000 mg | ORAL_TABLET | Freq: Two times a day (BID) | ORAL | 0 refills | Status: AC | PRN

## 2022-08-19 MED ORDER — SENNA 8.6 MG PO TABS: 2.0000 | ORAL_TABLET | Freq: Two times a day (BID) | ORAL | 0 refills | Status: AC

## 2022-08-19 MED ORDER — CLOBETASOL PROPIONATE 0.05 % EX OINT: 1.0000 | TOPICAL_OINTMENT | Freq: Two times a day (BID) | CUTANEOUS | 1 refills | Status: AC

## 2022-08-19 MED ORDER — HYDROXYZINE HCL 10 MG/5ML PO SYRP: 10.0000 mg | ORAL_SOLUTION | Freq: Three times a day (TID) | ORAL | 1 refills | Status: AC | PRN

## 2022-08-19 MED ORDER — METRONIDAZOLE 0.75 % EX CREA: 1.0000 | TOPICAL_CREAM | Freq: Every day | CUTANEOUS | 0 refills | Status: AC

## 2022-08-19 MED ORDER — FLUTICASONE PROPIONATE 50 MCG/ACT NA SUSP: 1.0000 | Freq: Every day | NASAL | 0 refills | Status: AC

## 2022-08-19 MED ORDER — CENTRUM PO CHEW: 1.0000 | CHEWABLE_TABLET | Freq: Every day | ORAL | 0 refills | Status: AC

## 2022-08-19 MED ORDER — TACROLIMUS 0.1 % EX OINT: 1.0000 | TOPICAL_OINTMENT | Freq: Two times a day (BID) | CUTANEOUS | 0 refills | Status: AC

## 2022-08-19 MED ORDER — KETOCONAZOLE 2 % EX SHAM: 1.0000 | MEDICATED_SHAMPOO | CUTANEOUS | 1 refills | Status: AC

## 2022-08-19 MED ORDER — SODIUM FLUORIDE 1.1 % DT GEL: 1.0000 | Freq: Every day | DENTAL | 0 refills | Status: AC

## 2022-08-19 MED ORDER — BUSPIRONE HCL 10 MG PO TABS: 5.0000 mg | ORAL_TABLET | Freq: Three times a day (TID) | ORAL | 0 refills | Status: AC

## 2022-08-19 MED ORDER — BENZOYL PEROXIDE WASH 5 % EX LIQD: 1.0000 "application " | CUTANEOUS | 1 refills | Status: AC

## 2022-08-19 MED ORDER — ACETAMINOPHEN 325 MG PO TABS: 650.0000 mg | ORAL_TABLET | Freq: Three times a day (TID) | ORAL | 0 refills | Status: AC | PRN

## 2022-08-19 MED ORDER — CALCIUM CARBONATE 1250 (500 CA) MG PO TABS: 1.0000 | ORAL_TABLET | Freq: Every day | ORAL | 0 refills | Status: AC

## 2022-08-19 NOTE — TOC Transition Note (Signed)
Transition of Care [facility]) - CM/SW Discharge Note   Patient Details  Name: Derrick Jordan MRN: [id number] Date of Birth: 1985-07-24  [facility]) CM/SW Contact:  Darleene Cleaver, LCSW Phone Number: 08/19/2022, 12:24 PM   Clinical Narrative:     CSW was informed by Laurena Bering that patient has a bed at an [facility] in [place].  The provider is through [facility], the caregiver will be Axel Filler, [phone], patient will be going to [address] [place], [place], [address].  Patient will be receiving HH RN from [facility], confirmation received from [place].  Patient's medications were to be sent to [facility], [address] [place], [place], [address], phone number is [phone], fax is [phone].  CSW spoke to [employer] and she is aware that patient is discharging today.    Final next level of care: Other (comment) ([facility].) Barriers to Discharge: Barriers Resolved   Patient Goals and CMS Choice CMS Medicare.gov Compare Post Acute Care list provided to:: Patient Represenative (must comment) Choice offered to / list presented to : [facility] POA / Guardian  Discharge Placement                  Patient to be transferred to facility by: EMS Name of family member notified: Laurena Bering rep Shannise Patient and family notified of of transfer: 08/19/22  Discharge Plan and Services Additional resources added to the After Visit Summary for       Post Acute Care Choice:  ([facility])                    HH Arranged: RN [facility] Agency: [facility] Date [facility] Agency Contacted: 08/19/22 Time HH Agency Contacted: 1223 Representative spoke with at [facility] Agency: [place]  Social Determinants of Health (SDOH) Interventions     Readmission Risk Interventions     No data to display

## 2022-08-19 NOTE — ED Provider Notes (Signed)
TOC is arranged placement at group home, patient is appropriate for discharge   Jene Every, MD 08/19/22 1304

## 2022-08-19 NOTE — ED Notes (Signed)
Pharmacy removed 10 tablets of Prucalopride (patient-supplied medication from home) for administration while patient is in the ED/awaiting placement. Original medication bottle with the 10 tablets remained with the pharmacy to dispense medication; remaining medication placed by pharmacist in amber pill bottle with patient label and name of drug and returned to patient's mother.

## 2022-08-19 NOTE — ED Notes (Signed)
Tacrolimus ointment delivered to pharmacist by this RN for medication verification and labeling (medication unavailable from pharmacy). Medication placed at patient bedside.

## 2022-08-19 NOTE — ED Notes (Signed)
Patient resting comfortably in bed. Patient eating breakfast tray. Given cup of coffee as requested. No other needs expressed at this time.

## 2022-08-19 NOTE — ED Notes (Addendum)
Pts mother, Ms. Lesly Dukes, to ED and writer called to come speak with her due to patient being transferred out to care home today. Clinical research associate provided [employer] information for [employer] DSS social worker assigned to pts case.Instructions provided to make contact due to writers inability to proved further information due to [facility] DSS current legal guardian. Ms.Stutts stated, "They don't have guardianship, I am his guardian. I will have to call my lawyer and let him work this outAir cabin crew provided understanding by apologizing for inability to provide further information, as well as reiterated the need to contact pts Child psychotherapist. Pts mother provided verbal understanding and departed the ED with contact information provided.

## 2022-08-19 NOTE — ED Notes (Signed)
Called [facility] to transport to Facility in [place].

## 2022-08-25 NOTE — TOC Progression Note (Signed)
Transition of Care [facility]) - Progression Note    Patient Details  Name: Derrick Jordan MRN: [id number] Date of Birth: [birth year]  Transition [facility]) CM/SW Contact  Darleene Cleaver, [place] Phone Number: 08/25/2022, 2:27 PM  Clinical Narrative:     CSW received phone call from [place] from [place] [phone], she was inquiring if patient can get PT added to the home health visits due to patient needing to be assessed for a wheelchair and also getting him a Nurse, adult.  CSW informed her that because patient has discharged, the hospital can not assist.  CSW suggested she speak to the [facility] RN to see if they can get PT added from the PCP.  CSW also advised her that the PCP would have to be the one writing the DME orders for what he will need.  CSW informed her that [employer] and [employer] can provide the equipment if they get an order from [facility].  CSW provided her with both phone numbers.         Expected Discharge Plan and Services                                               Social Determinants of Health (SDOH) Interventions    Readmission Risk Interventions     No data to display

## 2022-11-23 ENCOUNTER — Ambulatory Visit (HOSPITAL_BASED_OUTPATIENT_CLINIC_OR_DEPARTMENT_OTHER): Payer: Medicare PPO | Admitting: Family Medicine

## 2024-01-13 IMAGING — DX DG CHEST 1V PORT
1 series · 1 of 1 positions shown · non-contrast
Comparison: None.

CLINICAL DATA: Weakness

EXAM:
PORTABLE CHEST 1 VIEW

[chest ap]
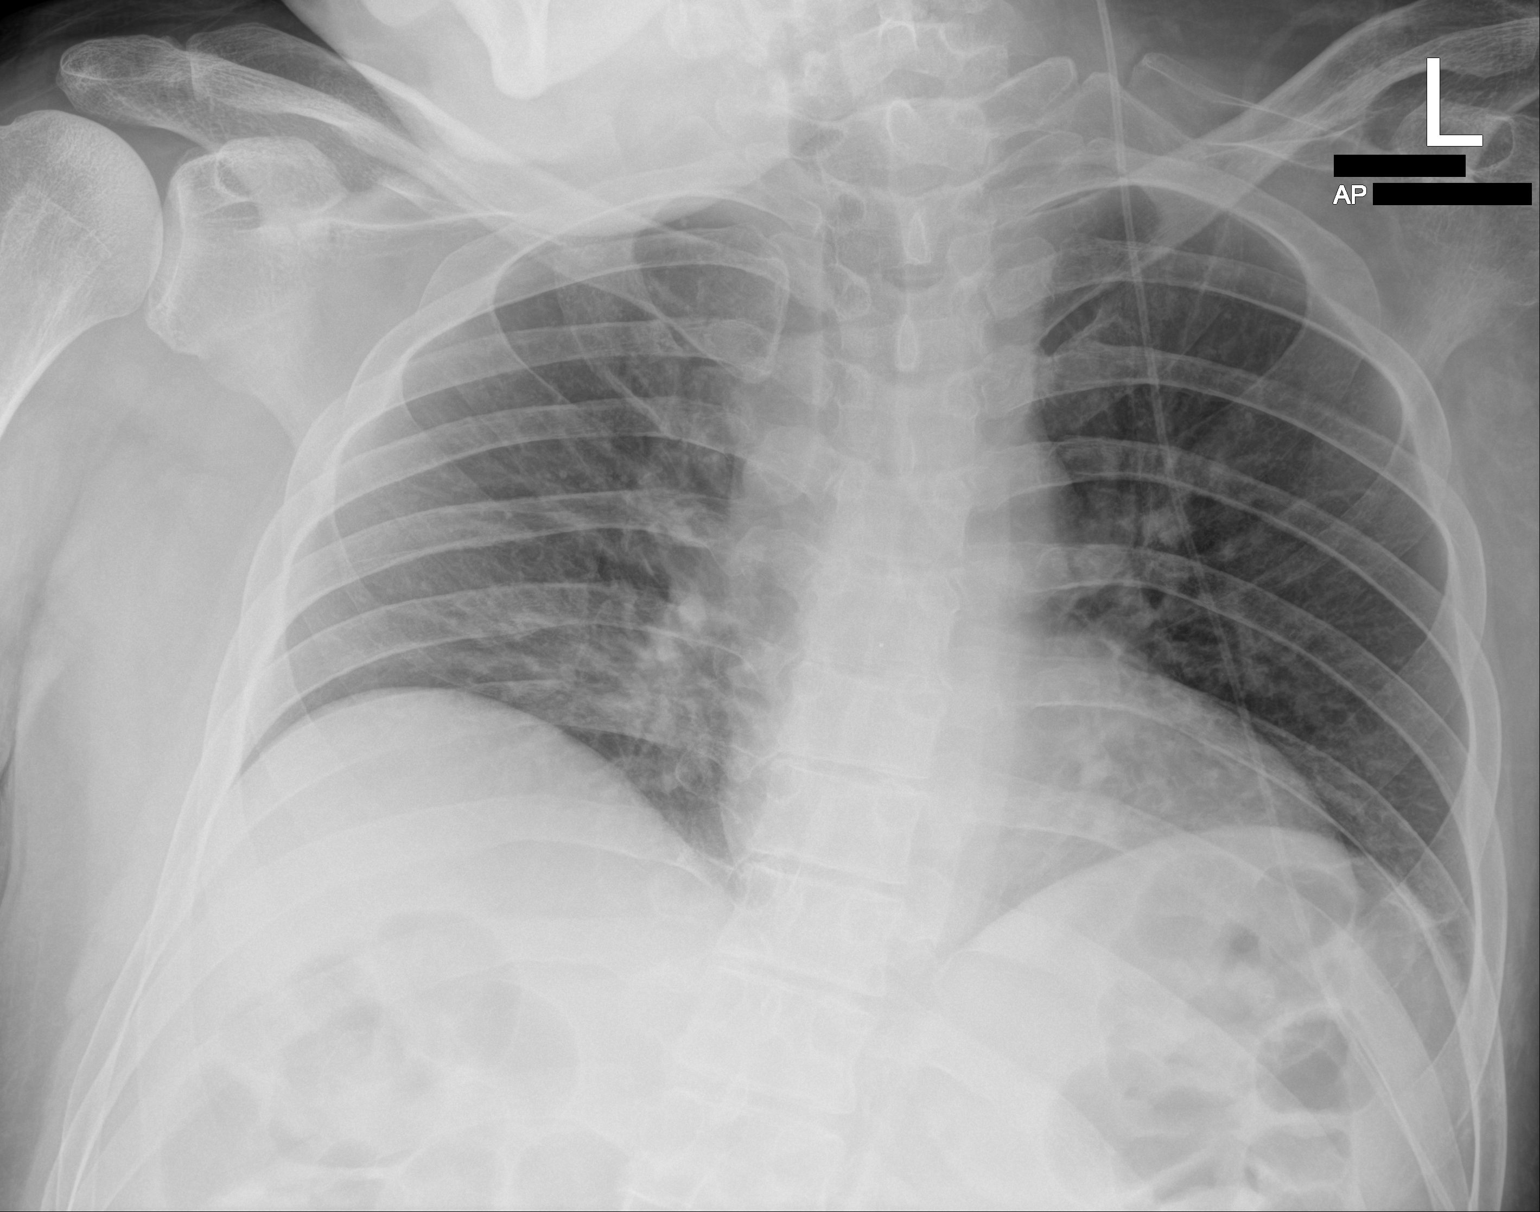

[1 of 1 positions shown; findings below may reference images not displayed]

FINDINGS: The heart size and mediastinal contours are within normal limits.
Low lung volumes with mildly increased right basilar markings. No
lobar consolidation. No pleural effusion. No pneumothorax. The
visualized skeletal structures are unremarkable. Shunt tubing
overlies the left chest.
IMPRESSION: Low lung volumes with mildly increased right basilar markings, which
may represent atelectasis or developing infiltrate.
